# Patient Record
Sex: Female | Born: 1990 | Race: White | Hispanic: No | Marital: Single | Smoking: Current every day smoker
Health system: Southern US, Community
[De-identification: ages and names within clinical notes are randomized; demographics above are authoritative.]

## PROBLEM LIST (undated history)

## (undated) DIAGNOSIS — J449 Chronic obstructive pulmonary disease, unspecified: Secondary | ICD-10-CM

## (undated) DIAGNOSIS — D332 Benign neoplasm of brain, unspecified: Secondary | ICD-10-CM

## (undated) DIAGNOSIS — J45909 Unspecified asthma, uncomplicated: Secondary | ICD-10-CM

## (undated) DIAGNOSIS — R569 Unspecified convulsions: Secondary | ICD-10-CM

## (undated) DIAGNOSIS — J219 Acute bronchiolitis, unspecified: Secondary | ICD-10-CM

## (undated) HISTORY — PX: SKIN GRAFT: SHX250

## (undated) HISTORY — PX: WRIST SURGERY: SHX841

---

## 2012-02-08 DIAGNOSIS — E229 Hyperfunction of pituitary gland, unspecified: Secondary | ICD-10-CM | POA: Insufficient documentation

## 2012-02-08 DIAGNOSIS — D352 Benign neoplasm of pituitary gland: Secondary | ICD-10-CM | POA: Insufficient documentation

## 2014-09-24 DIAGNOSIS — J45909 Unspecified asthma, uncomplicated: Secondary | ICD-10-CM | POA: Insufficient documentation

## 2014-11-02 DIAGNOSIS — F3130 Bipolar disorder, current episode depressed, mild or moderate severity, unspecified: Secondary | ICD-10-CM | POA: Insufficient documentation

## 2016-01-16 DIAGNOSIS — Z720 Tobacco use: Secondary | ICD-10-CM | POA: Insufficient documentation

## 2016-01-16 DIAGNOSIS — Z87898 Personal history of other specified conditions: Secondary | ICD-10-CM | POA: Insufficient documentation

## 2016-01-16 DIAGNOSIS — F1291 Cannabis use, unspecified, in remission: Secondary | ICD-10-CM | POA: Insufficient documentation

## 2016-01-17 DIAGNOSIS — M5136 Other intervertebral disc degeneration, lumbar region: Secondary | ICD-10-CM | POA: Insufficient documentation

## 2016-01-24 DIAGNOSIS — F603 Borderline personality disorder: Secondary | ICD-10-CM | POA: Insufficient documentation

## 2016-09-08 DIAGNOSIS — F4310 Post-traumatic stress disorder, unspecified: Secondary | ICD-10-CM | POA: Insufficient documentation

## 2017-03-10 ENCOUNTER — Emergency Department (HOSPITAL_COMMUNITY): Payer: Medicaid Other

## 2017-03-10 ENCOUNTER — Encounter (HOSPITAL_COMMUNITY): Payer: Self-pay

## 2017-03-10 DIAGNOSIS — Z79899 Other long term (current) drug therapy: Secondary | ICD-10-CM | POA: Diagnosis not present

## 2017-03-10 DIAGNOSIS — R0789 Other chest pain: Secondary | ICD-10-CM | POA: Insufficient documentation

## 2017-03-10 DIAGNOSIS — F17200 Nicotine dependence, unspecified, uncomplicated: Secondary | ICD-10-CM | POA: Diagnosis not present

## 2017-03-10 DIAGNOSIS — J40 Bronchitis, not specified as acute or chronic: Secondary | ICD-10-CM | POA: Insufficient documentation

## 2017-03-10 DIAGNOSIS — R079 Chest pain, unspecified: Secondary | ICD-10-CM | POA: Diagnosis present
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
## 2017-03-11 ENCOUNTER — Emergency Department (HOSPITAL_COMMUNITY)
Admission: EM | Admit: 2017-03-11 | Discharge: 2017-03-11 | Disposition: A | Discharge status: Home / Self Care | Payer: Medicaid Other | Attending: Emergency Medicine | Admitting: Emergency Medicine

## 2017-03-11 DIAGNOSIS — J40 Bronchitis, not specified as acute or chronic: Secondary | ICD-10-CM

## 2017-03-11 DIAGNOSIS — R0789 Other chest pain: Secondary | ICD-10-CM

## 2017-03-11 HISTORY — DX: Unspecified asthma, uncomplicated: J45.909

## 2017-03-11 HISTORY — DX: Benign neoplasm of brain, unspecified: D33.2

## 2017-03-11 HISTORY — DX: Unspecified convulsions: R56.9

## 2017-03-11 MED ORDER — DEXAMETHASONE SODIUM PHOSPHATE 4 MG/ML IJ SOLN
8.0000 mg | Freq: Once | INTRAMUSCULAR | Status: AC
  Administered 2017-03-11: 8 mg via INTRAMUSCULAR
  Filled 2017-03-11: qty 2

## 2017-03-11 MED ORDER — DEXAMETHASONE 4 MG PO TABS: 4.0000 mg | ORAL_TABLET | Freq: Two times a day (BID) | ORAL | 0 refills | Status: DC

## 2017-03-11 MED ORDER — IBUPROFEN 600 MG PO TABS: 600.0000 mg | ORAL_TABLET | Freq: Four times a day (QID) | ORAL | 0 refills | Status: DC

## 2017-03-11 MED ORDER — HYDROCOD POLST-CPM POLST ER 10-8 MG/5ML PO SUER
5.0000 mL | Freq: Once | ORAL | Status: AC
  Administered 2017-03-11: 5 mL via ORAL
  Filled 2017-03-11: qty 5

## 2017-03-11 MED ORDER — PROMETHAZINE-DM 6.25-15 MG/5ML PO SYRP: 5.0000 mL | ORAL_SOLUTION | Freq: Four times a day (QID) | ORAL | 0 refills | Status: DC | PRN

## 2017-03-11 MED ORDER — KETOROLAC TROMETHAMINE 10 MG PO TABS
10.0000 mg | ORAL_TABLET | Freq: Once | ORAL | Status: AC
  Administered 2017-03-11: 10 mg via ORAL
  Filled 2017-03-11: qty 1

## 2017-03-11 NOTE — Discharge Instructions (Signed)
Your x-ray is negative for pneumonia, collapsed lung, or other acute lung problem. Your x-ray is negative for rib fracture or rib dislocation. Your examination favors some bronchitis changes. Please use Decadron 2 times daily. Use ibuprofen with breakfast, lunch, dinner, and at bedtime. Use promethazine DM cough medication every 6 hours for cough and congestion. Please increase fluids. Please stop smoking.

## 2017-03-11 NOTE — ED Notes (Signed)
ED Provider at bedside. 
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
## 2017-03-16 ENCOUNTER — Ambulatory Visit (INDEPENDENT_AMBULATORY_CARE_PROVIDER_SITE_OTHER): Payer: Medicaid Other | Admitting: Advanced Practice Midwife

## 2017-03-16 ENCOUNTER — Encounter: Payer: Self-pay | Admitting: Advanced Practice Midwife

## 2017-03-16 VITALS — BP 118/68 | HR 71 | Ht 67.0 in | Wt 292.0 lb

## 2017-03-16 DIAGNOSIS — Z30432 Encounter for removal of intrauterine contraceptive device: Secondary | ICD-10-CM | POA: Diagnosis not present

## 2017-03-16 NOTE — Progress Notes (Signed)
  Tina Wood 26 y.o.  Vitals:   03/16/17 1448  BP: 118/68  Pulse: 71   Past Medical History:  Diagnosis Date  . Asthma   . Brain tumor (benign) ([place])   . Seizures ([place])    Past Surgical History:  Procedure Laterality Date  . SKIN GRAFT     family history is not on file.  Current Outpatient Prescriptions:  .  ARIPiprazole (ABILIFY) 10 MG tablet, Take 10 mg by mouth daily., Disp: , Rfl:  .  busPIRone (BUSPAR) 7.5 MG tablet, Take 7.5 mg by mouth 3 (three) times daily., Disp: , Rfl:  .  dexamethasone (DECADRON) 4 MG tablet, Take 1 tablet (4 mg total) by mouth 2 (two) times daily with a meal., Disp: 12 tablet, Rfl: 0 .  ibuprofen (ADVIL,MOTRIN) 600 MG tablet, Take 1 tablet (600 mg total) by mouth 4 (four) times daily., Disp: 30 tablet, Rfl: 0 .  naproxen (NAPROSYN) 250 MG tablet, Take by mouth 2 (two) times daily with a meal., Disp: , Rfl:     Here for IUD removal.  She had the Mirena IUD placed in July last year and would like it removed to get pregnant. Also, she has been havin g sparp pains for 2 mo nths Says ED [place] says it was in the right position.  A graves speculum was placed, and the strings were not visible.A long [name] was u=inderted just inside the os and the strings were grasped with a curved [name] and the IUD easily removed.  The IUD was only 1 cm above where I grasped the strings, so it was most likely in the cervix.Pt to start PNV.   Pt given IUD removal f/u instructions.

## 2017-03-17 ENCOUNTER — Emergency Department (HOSPITAL_COMMUNITY)
Admission: EM | Admit: 2017-03-17 | Discharge: 2017-03-18 | Disposition: A | Discharge status: Home / Self Care | Payer: Medicaid Other | Attending: Emergency Medicine | Admitting: Emergency Medicine

## 2017-03-17 ENCOUNTER — Encounter (HOSPITAL_COMMUNITY): Payer: Self-pay | Admitting: Emergency Medicine

## 2017-03-17 DIAGNOSIS — F17210 Nicotine dependence, cigarettes, uncomplicated: Secondary | ICD-10-CM | POA: Insufficient documentation

## 2017-03-17 DIAGNOSIS — Y939 Activity, unspecified: Secondary | ICD-10-CM | POA: Insufficient documentation

## 2017-03-17 DIAGNOSIS — Z86011 Personal history of benign neoplasm of the brain: Secondary | ICD-10-CM | POA: Insufficient documentation

## 2017-03-17 DIAGNOSIS — Y999 Unspecified external cause status: Secondary | ICD-10-CM | POA: Insufficient documentation

## 2017-03-17 DIAGNOSIS — M79661 Pain in right lower leg: Secondary | ICD-10-CM | POA: Diagnosis present

## 2017-03-17 DIAGNOSIS — J45909 Unspecified asthma, uncomplicated: Secondary | ICD-10-CM | POA: Diagnosis not present

## 2017-03-17 DIAGNOSIS — W57XXXA Bitten or stung by nonvenomous insect and other nonvenomous arthropods, initial encounter: Secondary | ICD-10-CM | POA: Diagnosis not present

## 2017-03-17 DIAGNOSIS — G43009 Migraine without aura, not intractable, without status migrainosus: Secondary | ICD-10-CM | POA: Diagnosis not present

## 2017-03-17 DIAGNOSIS — Y929 Unspecified place or not applicable: Secondary | ICD-10-CM | POA: Diagnosis not present

## 2017-03-17 DIAGNOSIS — Z79899 Other long term (current) drug therapy: Secondary | ICD-10-CM | POA: Diagnosis not present

## 2017-03-17 DIAGNOSIS — S80861A Insect bite (nonvenomous), right lower leg, initial encounter: Secondary | ICD-10-CM | POA: Insufficient documentation

## 2017-03-17 MED ORDER — DIPHENHYDRAMINE HCL 50 MG/ML IJ SOLN
50.0000 mg | Freq: Once | INTRAMUSCULAR | Status: AC
  Administered 2017-03-18: 50 mg via INTRAMUSCULAR
  Filled 2017-03-17: qty 1

## 2017-03-17 MED ORDER — FAMOTIDINE 20 MG PO TABS
20.0000 mg | ORAL_TABLET | Freq: Once | ORAL | Status: AC
  Administered 2017-03-18: 20 mg via ORAL
  Filled 2017-03-17: qty 1

## 2017-03-17 MED ORDER — PREDNISONE 50 MG PO TABS
60.0000 mg | ORAL_TABLET | Freq: Once | ORAL | Status: AC
  Administered 2017-03-18: 60 mg via ORAL
  Filled 2017-03-17: qty 1

## 2017-03-17 MED ORDER — METOCLOPRAMIDE HCL 5 MG/ML IJ SOLN: 20.0000 mg | Freq: Once | INTRAVENOUS | Status: DC

## 2017-03-17 NOTE — ED Triage Notes (Signed)
Pt c/o bee sting to the right leg x 75minutes ago. Pt states right leg is swollen and numb.

## 2017-03-17 NOTE — ED Notes (Signed)
Upon arrival into pts room. Pt bent and placed her full body weight on affected right leg when transferring from wheelchair to bed.

## 2017-03-18 MED ORDER — FAMOTIDINE 20 MG PO TABS: 20.0000 mg | ORAL_TABLET | Freq: Two times a day (BID) | ORAL | 0 refills | Status: DC

## 2017-03-18 MED ORDER — METOCLOPRAMIDE HCL 5 MG/ML IJ SOLN
20.0000 mg | Freq: Once | INTRAMUSCULAR | Status: AC
  Administered 2017-03-18: 20 mg via INTRAMUSCULAR
  Filled 2017-03-18: qty 4

## 2017-03-18 NOTE — ED Provider Notes (Signed)
[facility] DEPT Provider Note   CSN: [id number] Arrival date & time: 03/17/17  2157  Time seen 23:50 PM   History   Chief Complaint Chief Complaint  Patient presents with  . Leg Pain    HPI Tina Wood is a 26 y.o. female.  HPI  patient states she was sitting on someone else's porch about 25-30 minutes prior to arrival and she got stung by a bee on her right lower leg. She states it is itching and burning, she has taken no medications. She states she feels short of breath and feels like she's gasping for air however she is calmly lying on her stretcher. She states she feels like she's having trouble breathing from her chest, she denies any swelling in her throat, tongue, lips, or hives or itching on her body. She states she had a reaction about 4 years ago and was prescribed an EpiPen. She did not have it today. She also states after the sting she now has a migraine headache which she's had in the past. She states she has a pounding headache in the frontal and temples of bilateral sides of her head with some nausea without vomiting or change in her vision.  PCP none  Past Medical History:  Diagnosis Date  . Asthma   . Brain tumor (benign) ([place])   . Seizures [facility])     Patient Active Problem List   Diagnosis Date Noted  . Encounter for IUD removal 03/16/2017    Past Surgical History:  Procedure Laterality Date  . SKIN GRAFT      OB History    Gravida Para Term Preterm AB Living   2 1 1   1 1    SAB TAB Ectopic Multiple Live Births   1               Home Medications    Prior to Admission medications   Medication Sig Start Date End Date Taking? Authorizing Provider  ARIPiprazole (ABILIFY) 10 MG tablet Take 10 mg by mouth daily.   Yes [provider]  busPIRone (BUSPAR) 7.5 MG tablet Take 7.5 mg by mouth 3 (three) times daily.   Yes [provider]  dexamethasone (DECADRON) 4 MG tablet Take 1 tablet (4 mg total) by mouth 2 (two) times daily  with a meal. 03/11/17  Yes Lily Kocher, PA-C  ibuprofen (ADVIL,MOTRIN) 600 MG tablet Take 1 tablet (600 mg total) by mouth 4 (four) times daily. 03/11/17  Yes Lily Kocher, PA-C  famotidine (PEPCID) 20 MG tablet Take 1 tablet (20 mg total) by mouth 2 (two) times daily. 03/18/17   Rolland Porter, MD    Family History History reviewed. No pertinent family history.  Social History Social History  Substance Use Topics  . Smoking status: Current Every Day Smoker    Packs/day: 1.00    Types: Cigarettes  . Smokeless tobacco: Never Used  . Alcohol use No  on disability for a benign pituitary tumor   Allergies   Bee venom   Review of Systems Review of Systems  All other systems reviewed and are negative.    Physical Exam Updated Vital Signs BP 98/60   Pulse 91   Temp 98.6 F (37 C)   Resp 18   Ht 5\' 7"  (1.702 m)   Wt (!) 142.9 kg (315 lb)   SpO2 99%   BMI 49.34 kg/m   Vital signs normal except borderline hypotension   Physical Exam  Constitutional: She is oriented to person, place,  and time. She appears well-developed and well-nourished.  obese  HENT:  Head: Normocephalic.  Right Ear: External ear normal.  Left Ear: External ear normal.  Nose: Nose normal.  Mouth/Throat: Oropharynx is clear and moist.  Eyes: Conjunctivae and EOM are normal.  Neck: Neck supple.  Cardiovascular: Normal rate.   Pulmonary/Chest: Effort normal. No respiratory distress.  Musculoskeletal: Normal range of motion. She exhibits tenderness. She exhibits no edema or deformity.  Neurological: She is alert and oriented to person, place, and time. No cranial nerve deficit.  Skin: Skin is warm and dry. Capillary refill takes less than 2 seconds. No rash noted. There is erythema. There is pallor.  Psychiatric: Her speech is normal and behavior is normal.  Flat affect  Nursing note and vitals reviewed.      ED Treatments / Results  Labs (all labs ordered are listed, but only abnormal results  are displayed) Labs Reviewed - No data to display  EKG  EKG Interpretation None       Radiology No results found.  Procedures Procedures (including critical care time)  Medications Ordered in ED Medications  diphenhydrAMINE (BENADRYL) injection 50 mg (50 mg Intramuscular Given 03/18/17 0006)  predniSONE (DELTASONE) tablet 60 mg (60 mg Oral Given 03/18/17 0006)  famotidine (PEPCID) tablet 20 mg (20 mg Oral Given 03/18/17 0006)  metoCLOPramide (REGLAN) injection 20 mg (20 mg Intramuscular Given 03/18/17 0006)     Initial Impression / Assessment and Plan / ED Course  I have reviewed the triage vital signs and the nursing notes.  Pertinent labs & imaging results that were available during my care of the patient were reviewed by me and considered in my medical decision making (see chart for details).   Patient was given IM Benadryl, and oral prednisone and Pepcid for her insect sting. For her migraine headache she was given Reglan IM. Patient chose IM over IV medication.   Recheck at 1:25 AM patient states her headaches gone and her insect bite  feels much better. Patient has an extremely well localized insect bite, she was not placed on steroids. At time of discharge she had the very small bite visible.  Final Clinical Impressions(s) / ED Diagnoses   Final diagnoses:  Insect bite of lower extremity, right, initial encounter  Migraine without aura and without status migrainosus, not intractable    New Prescriptions New Prescriptions   FAMOTIDINE (PEPCID) 20 MG TABLET    Take 1 tablet (20 mg total) by mouth 2 (two) times daily.  OTC benadryl  Plan discharge  Rolland Porter, MD, Barbette Or, MD 03/18/17 [phone]

## 2017-03-18 NOTE — Discharge Instructions (Signed)
Use ice pack over the bite for comfort. You can take benadryl OTC 25-50 mg every 6 hrs as needed for itching or discomfort. Take the pepcid twice a day until gone.   Recheck if you get increased redness, swelling, or you get swelling of your lips, tongue, throat, have trouble breathing.

## 2017-03-18 NOTE — ED Notes (Signed)
Pt ambulatory to waiting room. Pt verbalized understanding of discharge instructions.   

## 2017-04-02 ENCOUNTER — Encounter (HOSPITAL_COMMUNITY): Payer: Self-pay | Admitting: Emergency Medicine

## 2017-04-02 ENCOUNTER — Emergency Department (HOSPITAL_COMMUNITY)
Admission: EM | Admit: 2017-04-02 | Discharge: 2017-04-02 | Disposition: A | Discharge status: Home / Self Care | Payer: Medicaid Other | Attending: Emergency Medicine | Admitting: Emergency Medicine

## 2017-04-02 DIAGNOSIS — Z79899 Other long term (current) drug therapy: Secondary | ICD-10-CM | POA: Diagnosis not present

## 2017-04-02 DIAGNOSIS — R0602 Shortness of breath: Secondary | ICD-10-CM | POA: Insufficient documentation

## 2017-04-02 DIAGNOSIS — F17210 Nicotine dependence, cigarettes, uncomplicated: Secondary | ICD-10-CM | POA: Insufficient documentation

## 2017-04-02 DIAGNOSIS — L240 Irritant contact dermatitis due to detergents: Secondary | ICD-10-CM | POA: Diagnosis not present

## 2017-04-02 DIAGNOSIS — L299 Pruritus, unspecified: Secondary | ICD-10-CM | POA: Diagnosis present

## 2017-04-02 DIAGNOSIS — J45909 Unspecified asthma, uncomplicated: Secondary | ICD-10-CM | POA: Insufficient documentation

## 2017-04-02 NOTE — Discharge Instructions (Signed)
Take benadryl for itching.  Use topical hydrocortisone cream if continued redness

## 2017-04-02 NOTE — ED Triage Notes (Addendum)
Patient complaining of hives to right leg that started approximately 15 minutes prior to arrival to ER. States she does not know what she is allergic to. States "my throat hurts like it's hard to breathe and when I take a deep breath my chest kinda hurts." NAD noted at triage.

## 2017-04-02 NOTE — ED Provider Notes (Signed)
[facility] DEPT Provider Note   CSN: [id number] Arrival date & time: 04/02/17  2019     History   Chief Complaint Chief Complaint  Patient presents with  . Allergic Reaction    HPI Tina Wood is a 26 y.o. female.  The history is provided by the patient. No language interpreter was used.  Allergic Reaction  Presenting symptoms: difficulty breathing and itching   Severity:  Mild Prior allergic episodes:  No prior episodes Context: chemicals and medications   Relieved by:  Nothing Worsened by:  Nothing Ineffective treatments:  None tried Pt used a lice treatment and has irritation to an area on her leg and her scalp.  No current shortness of breath.  No throat tightness  Past Medical History:  Diagnosis Date  . Asthma   . Brain tumor (benign) ([place])   . Seizures [facility])     Patient Active Problem List   Diagnosis Date Noted  . Encounter for IUD removal 03/16/2017    Past Surgical History:  Procedure Laterality Date  . SKIN GRAFT    . WRIST SURGERY      OB History    Gravida Para Term Preterm AB Living   2 1 1   1 1    SAB TAB Ectopic Multiple Live Births   1               Home Medications    Prior to Admission medications   Medication Sig Start Date End Date Taking? Authorizing Provider  ARIPiprazole (ABILIFY) 10 MG tablet Take 10 mg by mouth daily.    [provider]  busPIRone (BUSPAR) 7.5 MG tablet Take 7.5 mg by mouth 3 (three) times daily.    [provider]  dexamethasone (DECADRON) 4 MG tablet Take 1 tablet (4 mg total) by mouth 2 (two) times daily with a meal. 03/11/17   Lily Kocher, PA-C  famotidine (PEPCID) 20 MG tablet Take 1 tablet (20 mg total) by mouth 2 (two) times daily. 03/18/17   Rolland Porter, MD  ibuprofen (ADVIL,MOTRIN) 600 MG tablet Take 1 tablet (600 mg total) by mouth 4 (four) times daily. 03/11/17   Lily Kocher, PA-C    Family History History reviewed. No pertinent family history.  Social History Social  History  Substance Use Topics  . Smoking status: Current Every Day Smoker    Packs/day: 1.00    Types: Cigarettes  . Smokeless tobacco: Never Used  . Alcohol use No     Allergies   Cherry; Pomegranate [punica]; Bee venom; and Grass extracts [gramineae pollens]   Review of Systems Review of Systems  Skin: Positive for itching.  All other systems reviewed and are negative.    Physical Exam Updated Vital Signs BP (!) 110/59 (BP Location: Right Arm)   Pulse (!) 125   Temp 98.4 F (36.9 C) (Oral)   Resp 20   Ht 5\' 7"  (1.702 m)   Wt (!) 142.9 kg (315 lb)   SpO2 99%   BMI 49.34 kg/m   Physical Exam  Constitutional: She appears well-developed and well-nourished. No distress.  HENT:  Head: Normocephalic and atraumatic.  Eyes: Conjunctivae are normal.  Neck: Neck supple.  Cardiovascular: Normal rate and regular rhythm.   No murmur heard. Pulmonary/Chest: Effort normal and breath sounds normal. No respiratory distress.  Abdominal: There is no tenderness.  Musculoskeletal: She exhibits no edema.  Erythema right upper inner thigh 4x10 cm area,    Neurological: She is alert.  Skin: Skin is warm  and dry.  Psychiatric: She has a normal mood and affect.  Nursing note and vitals reviewed.    ED Treatments / Results  Labs (all labs ordered are listed, but only abnormal results are displayed) Labs Reviewed - No data to display  EKG  EKG Interpretation None       Radiology No results found.  Procedures Procedures (including critical care time)  Medications Ordered in ED Medications - No data to display   Initial Impression / Assessment and Plan / ED Course  I have reviewed the triage vital signs and the nursing notes.  Pertinent labs & imaging results that were available during my care of the patient were reviewed by me and considered in my medical decision making (see chart for details).       Final Clinical Impressions(s) / ED Diagnoses   Final  diagnoses:  Contact dermatitis due to detergent, unspecified contact dermatitis type    New Prescriptions New Prescriptions   No medications on file   Pt looks well,  No sign of systemic reaction,  Pt advised benadryl and topical hydrocortisone    Fransico Meadow, PA-C 04/02/17 2153    Fredia Sorrow, MD 04/03/17 1626

## 2017-04-02 NOTE — ED Notes (Signed)
Pt c/o redness noted to right upper thigh area, as well as sob that started while on the way to see her boyfriend who is in the emergency room as well, pt states that she treated herself for lice today and is not sure if some of the medication got on her leg, pt sitting on stretcher upon RN entering room, no distress noted,

## 2017-04-02 NOTE — ED Notes (Signed)
ED Provider at bedside. 

## 2017-04-11 ENCOUNTER — Encounter (HOSPITAL_COMMUNITY): Payer: Self-pay | Admitting: *Deleted

## 2017-04-11 ENCOUNTER — Emergency Department (HOSPITAL_COMMUNITY)
Admission: EM | Admit: 2017-04-11 | Discharge: 2017-04-11 | Disposition: A | Discharge status: Home / Self Care | Payer: Medicaid Other | Attending: Emergency Medicine | Admitting: Emergency Medicine

## 2017-04-11 DIAGNOSIS — Z5321 Procedure and treatment not carried out due to patient leaving prior to being seen by health care provider: Secondary | ICD-10-CM | POA: Diagnosis not present

## 2017-04-11 DIAGNOSIS — R2241 Localized swelling, mass and lump, right lower limb: Secondary | ICD-10-CM | POA: Diagnosis present

## 2017-04-11 DIAGNOSIS — R0602 Shortness of breath: Secondary | ICD-10-CM | POA: Insufficient documentation

## 2017-04-11 NOTE — ED Triage Notes (Signed)
Pt was stung by bee on the back of right leg today. Pt c/o right leg swelling and SOB. Pt's respirations even, unlabored, lung sounds clear. NAD noted in triage.

## 2017-04-11 NOTE — ED Notes (Signed)
Per registration patient left. Registration stated patient was ambulatory at triage with steady gait noted.

## 2017-04-11 NOTE — ED Triage Notes (Signed)
Pt called for triage at 1750. Pt in restroom at this time.

## 2017-04-11 NOTE — ED Notes (Addendum)
Patient reports of bee sting to back of right leg, states she has hx of shortness of breath. Patient reports of shortness of breath. RR even and unlabored.  NAD at this time.

## 2017-04-14 ENCOUNTER — Encounter (HOSPITAL_COMMUNITY): Payer: Self-pay | Admitting: Emergency Medicine

## 2017-04-14 ENCOUNTER — Emergency Department (HOSPITAL_COMMUNITY)
Admission: EM | Admit: 2017-04-14 | Discharge: 2017-04-15 | Disposition: A | Discharge status: Home / Self Care | Payer: Medicaid Other | Attending: Emergency Medicine | Admitting: Emergency Medicine

## 2017-04-14 DIAGNOSIS — F17210 Nicotine dependence, cigarettes, uncomplicated: Secondary | ICD-10-CM | POA: Insufficient documentation

## 2017-04-14 DIAGNOSIS — G43009 Migraine without aura, not intractable, without status migrainosus: Secondary | ICD-10-CM | POA: Diagnosis not present

## 2017-04-14 DIAGNOSIS — Z91040 Latex allergy status: Secondary | ICD-10-CM | POA: Diagnosis not present

## 2017-04-14 DIAGNOSIS — Z79899 Other long term (current) drug therapy: Secondary | ICD-10-CM | POA: Insufficient documentation

## 2017-04-14 DIAGNOSIS — J45909 Unspecified asthma, uncomplicated: Secondary | ICD-10-CM | POA: Insufficient documentation

## 2017-04-14 DIAGNOSIS — R51 Headache: Secondary | ICD-10-CM | POA: Diagnosis present

## 2017-04-14 MED ORDER — METOCLOPRAMIDE HCL 5 MG/ML IJ SOLN
10.0000 mg | Freq: Once | INTRAMUSCULAR | Status: AC
  Administered 2017-04-14: 10 mg via INTRAMUSCULAR
  Filled 2017-04-14: qty 2

## 2017-04-14 MED ORDER — DIPHENHYDRAMINE HCL 25 MG PO CAPS
25.0000 mg | ORAL_CAPSULE | Freq: Once | ORAL | Status: AC
  Administered 2017-04-14: 25 mg via ORAL
  Filled 2017-04-14: qty 1

## 2017-04-14 MED ORDER — KETOROLAC TROMETHAMINE 60 MG/2ML IM SOLN
60.0000 mg | Freq: Once | INTRAMUSCULAR | Status: AC
  Administered 2017-04-14: 60 mg via INTRAMUSCULAR
  Filled 2017-04-14: qty 2

## 2017-04-14 NOTE — ED Notes (Signed)
Pt sleeping. 

## 2017-04-14 NOTE — ED Triage Notes (Signed)
Pt c/o headache all day and states she has a brain tumor but is not followed by a doctor at this time.

## 2017-04-15 NOTE — Discharge Instructions (Signed)
Follow-up with your doctor for recheck.  Return here if needed 

## 2017-04-15 NOTE — ED Provider Notes (Signed)
[facility] DEPT Provider Note   CSN: [id number] Arrival date & time: 04/14/17  2115     History   Chief Complaint Chief Complaint  Patient presents with  . Headache    HPI Tina Wood is a 26 y.o. female.  HPI  Tina Wood is a 26 y.o. female who presents to the Emergency Department complaining of gradual onset of diffuse headache.  She states she woke up with a worsening headache today that began in the top of her head that radiates to both temples.  She describes the pain as throbbing and increases with light or loud noises.  She reports hx of migraine headaches and this pain feels similar.  She reports improvement with previous headaches after taking ibuprofen or other over the counter pain relievers, but she did not take any today.  She denies fever, chills, neck pain or stiffness, visual deficits, facial numbness or weakness.   Past Medical History:  Diagnosis Date  . Asthma   . Brain tumor (benign) ([place])   . Seizures [facility])     Patient Active Problem List   Diagnosis Date Noted  . Encounter for IUD removal 03/16/2017    Past Surgical History:  Procedure Laterality Date  . SKIN GRAFT    . WRIST SURGERY      OB History    Gravida Para Term Preterm AB Living   2 1 1   1 1    SAB TAB Ectopic Multiple Live Births   1               Home Medications    Prior to Admission medications   Medication Sig Start Date End Date Taking? Authorizing Provider  busPIRone (BUSPAR) 7.5 MG tablet Take 7.5 mg by mouth 3 (three) times daily.   Yes [provider]  dexamethasone (DECADRON) 4 MG tablet Take 1 tablet (4 mg total) by mouth 2 (two) times daily with a meal. 03/11/17  Yes Lily Kocher, PA-C  ibuprofen (ADVIL,MOTRIN) 600 MG tablet Take 1 tablet (600 mg total) by mouth 4 (four) times daily. 03/11/17  Yes Lily Kocher, PA-C  ARIPiprazole (ABILIFY) 10 MG tablet Take 10 mg by mouth daily.    [provider]  famotidine (PEPCID) 20 MG tablet Take  1 tablet (20 mg total) by mouth 2 (two) times daily. Patient not taking: Reported on 04/14/2017 03/18/17   Rolland Porter, MD    Family History History reviewed. No pertinent family history.  Social History Social History  Substance Use Topics  . Smoking status: Current Every Day Smoker    Packs/day: 1.00    Types: Cigarettes  . Smokeless tobacco: Never Used  . Alcohol use No     Allergies   Cherry; Pomegranate [punica]; Bee venom; Nicotine; Grass extracts [gramineae pollens]; Latex; and Shellfish allergy   Review of Systems Review of Systems  Constitutional: Negative for activity change, appetite change and fever.  HENT: Negative for facial swelling and trouble swallowing.   Eyes: Positive for photophobia. Negative for pain and visual disturbance.  Respiratory: Negative for shortness of breath.   Cardiovascular: Negative for chest pain.  Gastrointestinal: Negative for abdominal pain, nausea and vomiting.  Genitourinary: Negative for dysuria.  Musculoskeletal: Negative for neck pain and neck stiffness.  Skin: Negative for rash and wound.  Neurological: Positive for headaches. Negative for dizziness, facial asymmetry, speech difficulty, weakness and numbness.  Psychiatric/Behavioral: Negative for confusion and decreased concentration.  All other systems reviewed and are negative.    Physical Exam Updated  Vital Signs BP 121/75 (BP Location: Left Arm)   Pulse (!) 110   Temp 98.9 F (37.2 C) (Oral)   Resp 18   Ht 5\' 7"  (1.702 m)   Wt 131.1 kg (289 lb)   SpO2 98%   BMI 45.26 kg/m   Physical Exam  Constitutional: She is oriented to person, place, and time. She appears well-developed and well-nourished. No distress.  HENT:  Head: Normocephalic and atraumatic.  Mouth/Throat: Oropharynx is clear and moist.  Eyes: Pupils are equal, round, and reactive to light. EOM are normal.  Neck: Normal range of motion and phonation normal. Neck supple. No spinous process tenderness and  no muscular tenderness present. No neck rigidity. No Kernig's sign noted.  Cardiovascular: Normal rate, regular rhythm and intact distal pulses.   No murmur heard. Pulmonary/Chest: Effort normal and breath sounds normal. No respiratory distress.  Abdominal: Soft. She exhibits no distension. There is no tenderness.  Musculoskeletal: Normal range of motion.  Neurological: She is alert and oriented to person, place, and time. She has normal strength. No cranial nerve deficit or sensory deficit. She exhibits normal muscle tone. Coordination and gait normal. GCS eye subscore is 4. GCS verbal subscore is 5. GCS motor subscore is 6.  Reflex Scores:      Tricep reflexes are 2+ on the right side and 2+ on the left side.      Bicep reflexes are 2+ on the right side and 2+ on the left side. Skin: Skin is warm and dry. Capillary refill takes less than 2 seconds.  Psychiatric: She has a normal mood and affect.  Nursing note and vitals reviewed.    ED Treatments / Results  Labs (all labs ordered are listed, but only abnormal results are displayed) Labs Reviewed - No data to display  EKG  EKG Interpretation None       Radiology No results found.  Procedures Procedures (including critical care time)  Medications Ordered in ED Medications  ketorolac (TORADOL) injection 60 mg (60 mg Intramuscular Given 04/14/17 2303)  metoCLOPramide (REGLAN) injection 10 mg (10 mg Intramuscular Given 04/14/17 2303)  diphenhydrAMINE (BENADRYL) capsule 25 mg (25 mg Oral Given 04/14/17 2304)     Initial Impression / Assessment and Plan / ED Course  I have reviewed the triage vital signs and the nursing notes.  Pertinent labs & imaging results that were available during my care of the patient were reviewed by me and considered in my medical decision making (see chart for details).     0010  On recheck, pt is feeling better, reports pain is down to "3"  States she is ready to go home.  Vitals stable.  No focal  neuro deficits, no meningeal signs. Headache pain similar to previous.   Appears stable for d/c  Final Clinical Impressions(s) / ED Diagnoses   Final diagnoses:  Migraine without aura and without status migrainosus, not intractable    New Prescriptions New Prescriptions   No medications on file     Kem Parkinson, PA-C 04/15/17 0037    Francine Graven, DO 04/16/17 1411

## 2017-04-20 ENCOUNTER — Emergency Department (HOSPITAL_COMMUNITY)
Admission: EM | Admit: 2017-04-20 | Discharge: 2017-04-20 | Disposition: A | Discharge status: Home / Self Care | Payer: Medicaid Other | Attending: Emergency Medicine | Admitting: Emergency Medicine

## 2017-04-20 ENCOUNTER — Encounter (HOSPITAL_COMMUNITY): Payer: Self-pay | Admitting: Emergency Medicine

## 2017-04-20 DIAGNOSIS — F17210 Nicotine dependence, cigarettes, uncomplicated: Secondary | ICD-10-CM | POA: Diagnosis not present

## 2017-04-20 DIAGNOSIS — H6122 Impacted cerumen, left ear: Secondary | ICD-10-CM | POA: Diagnosis not present

## 2017-04-20 DIAGNOSIS — Z91040 Latex allergy status: Secondary | ICD-10-CM | POA: Insufficient documentation

## 2017-04-20 DIAGNOSIS — H6502 Acute serous otitis media, left ear: Secondary | ICD-10-CM | POA: Diagnosis not present

## 2017-04-20 DIAGNOSIS — Z79899 Other long term (current) drug therapy: Secondary | ICD-10-CM | POA: Insufficient documentation

## 2017-04-20 DIAGNOSIS — H9203 Otalgia, bilateral: Secondary | ICD-10-CM | POA: Diagnosis present

## 2017-04-20 DIAGNOSIS — J45909 Unspecified asthma, uncomplicated: Secondary | ICD-10-CM | POA: Diagnosis not present

## 2017-04-20 MED ORDER — AMOXICILLIN-POT CLAVULANATE 875-125 MG PO TABS: 1.0000 | ORAL_TABLET | Freq: Two times a day (BID) | ORAL | 0 refills | Status: AC

## 2017-04-20 NOTE — ED Triage Notes (Signed)
Pt c/o bilateral ear pain x 3 days.

## 2017-04-20 NOTE — ED Provider Notes (Signed)
[facility] DEPT Provider Note   CSN: [id number] Arrival date & time: 04/20/17  4656     History   Chief Complaint Chief Complaint  Patient presents with  . Otalgia    HPI Tina Wood is a 26 y.o. female presenting with sudden onset bilateral otalgia for the last 3 days. No history of recent swimming or recurrent otitis media. She reports that she noted some bloody discharge in her left ear on her way here today. Denies fever, chills, or any other symptoms. She has been taking NyQuil and ibuprofen with some relief.  She later reported that she had been having a productive cough for a week that has been improving and patient was evaluated a few days ago with negative chest x-ray.  HPI  Past Medical History:  Diagnosis Date  . Asthma   . Brain tumor (benign) ([place])   . Seizures [facility])     Patient Active Problem List   Diagnosis Date Noted  . Encounter for IUD removal 03/16/2017    Past Surgical History:  Procedure Laterality Date  . SKIN GRAFT    . WRIST SURGERY      OB History    Gravida Para Term Preterm AB Living   2 1 1   1 1    SAB TAB Ectopic Multiple Live Births   1               Home Medications    Prior to Admission medications   Medication Sig Start Date End Date Taking? Authorizing Provider  amoxicillin-clavulanate (AUGMENTIN) 875-125 MG tablet Take 1 tablet by mouth 2 (two) times daily. 04/20/17 04/27/17  Avie Echevaria B, PA-C  ARIPiprazole (ABILIFY) 10 MG tablet Take 10 mg by mouth daily.    [provider]  busPIRone (BUSPAR) 7.5 MG tablet Take 7.5 mg by mouth 3 (three) times daily.    [provider]  dexamethasone (DECADRON) 4 MG tablet Take 1 tablet (4 mg total) by mouth 2 (two) times daily with a meal. 03/11/17   Lily Kocher, PA-C  famotidine (PEPCID) 20 MG tablet Take 1 tablet (20 mg total) by mouth 2 (two) times daily. Patient not taking: Reported on 04/14/2017 03/18/17   Rolland Porter, MD  ibuprofen (ADVIL,MOTRIN) 600  MG tablet Take 1 tablet (600 mg total) by mouth 4 (four) times daily. 03/11/17   Lily Kocher, PA-C    Family History History reviewed. No pertinent family history.  Social History Social History  Substance Use Topics  . Smoking status: Current Every Day Smoker    Packs/day: 1.00    Types: Cigarettes  . Smokeless tobacco: Never Used  . Alcohol use No     Allergies   Cherry; Pomegranate [punica]; Bee venom; Nicotine; Grass extracts [gramineae pollens]; Latex; and Shellfish allergy   Review of Systems Review of Systems  Constitutional: Negative for chills and fever.  HENT: Positive for congestion, ear discharge and ear pain. Negative for facial swelling, sinus pain, sinus pressure, sore throat, tinnitus and trouble swallowing.   Respiratory: Positive for cough. Negative for choking, shortness of breath, wheezing and stridor.   Cardiovascular: Negative for chest pain and palpitations.  Gastrointestinal: Negative for abdominal pain, nausea and vomiting.  Genitourinary: Negative for dysuria and hematuria.  Musculoskeletal: Negative for arthralgias, back pain, myalgias, neck pain and neck stiffness.  Skin: Negative for color change, pallor and rash.  Neurological: Negative for dizziness, seizures, syncope and headaches.     Physical Exam Updated Vital Signs BP 114/79   Pulse  86   Temp 98.4 F (36.9 C)   Resp 18   Ht 5\' 7"  (1.702 m)   Wt 131.1 kg (289 lb)   LMP 04/17/2017   SpO2 97%   BMI 45.26 kg/m   Physical Exam  Constitutional: She appears well-developed and well-nourished. No distress.  Afebrile, nontoxic-appearing, sitting comfortably in bed in no acute distress.  HENT:  Head: Normocephalic and atraumatic.  Right Ear: External ear normal.  Left Ear: External ear normal.  Left tympanic membrane with significant effusion and erythema. Initially difficult to visualize due to cerumen impaction. No concern for mastoiditis.  Eyes: Conjunctivae and EOM are normal.  Right eye exhibits no discharge. Left eye exhibits no discharge.  Neck: Normal range of motion.  Cardiovascular: Normal rate, regular rhythm and normal heart sounds.   No murmur heard. Pulmonary/Chest: Effort normal and breath sounds normal. No respiratory distress. She has no wheezes. She has no rales.  Musculoskeletal: Normal range of motion. She exhibits no edema or deformity.  Lymphadenopathy:    She has no cervical adenopathy.  Neurological: She is alert.  Skin: Skin is warm and dry. No rash noted. She is not diaphoretic. No erythema. No pallor.  Psychiatric: She has a normal mood and affect.  Nursing note and vitals reviewed.    ED Treatments / Results  Labs (all labs ordered are listed, but only abnormal results are displayed) Labs Reviewed - No data to display  EKG  EKG Interpretation None       Radiology No results found.  Procedures .Ear Cerumen Removal Date/Time: 04/20/2017 8:34 PM Performed by: Avie Echevaria B Authorized by: Avie Echevaria B   Consent:    Consent obtained:  Verbal   Consent given by:  Patient   Risks discussed:  Pain   Alternatives discussed:  No treatment Procedure details:    Location:  L ear   Procedure type: curette   Post-procedure details:    Inspection:  TM intact   Hearing quality:  Improved   Patient tolerance of procedure:  Tolerated well, no immediate complications   (including critical care time)  Medications Ordered in ED Medications - No data to display   Initial Impression / Assessment and Plan / ED Course  I have reviewed the triage vital signs and the nursing notes.  Pertinent labs & imaging results that were available during my care of the patient were reviewed by me and considered in my medical decision making (see chart for details).     Patient presenting with bilateral lateral otalgia and left otitis media. She has been having upper respiratory symptoms for about a week. Evaluated a few days ago for  productive cough with negative chest x-ray.  Patient reported improvement after cerumen removal.  Discharge home with Augmentin and close follow-up with PCP as needed. Patient was otherwise well-appearing, afebrile nontoxic with normal vital signs and stable. Advised to stay well-hydrated.  Discussed strict return precautions and advised to return to the emergency department if experiencing any new or worsening symptoms. Instructions were understood and patient agreed with discharge plan. Final Clinical Impressions(s) / ED Diagnoses   Final diagnoses:  Acute serous otitis media of left ear, recurrence not specified  Impacted cerumen of left ear    New Prescriptions New Prescriptions   AMOXICILLIN-CLAVULANATE (AUGMENTIN) 875-125 MG TABLET    Take 1 tablet by mouth 2 (two) times daily.     Dossie Der 04/20/17 2039    Nat Christen, MD 04/27/17 [phone]

## 2017-04-20 NOTE — ED Notes (Signed)
Pt c/o bilateral ear pain, worse on L. Has had bloody d/c from L ear, denies placing anything into ears. Also has decreased hearing in L side.

## 2017-04-20 NOTE — Discharge Instructions (Signed)
As discussed, take your entire course of antibiotics even if you feel better. Stay well hydrated drinking plenty of water.  Follow-up with your primary care provider as needed. Return if cough worsens, difficulty breathing, chest pain, or any other new concerning symptoms in the meantime.

## 2017-05-18 ENCOUNTER — Encounter (HOSPITAL_COMMUNITY): Payer: Self-pay

## 2017-05-18 ENCOUNTER — Ambulatory Visit (HOSPITAL_COMMUNITY)
Admission: EM | Admit: 2017-05-18 | Discharge: 2017-05-18 | Disposition: A | Discharge status: Home / Self Care | Payer: No Typology Code available for payment source | Source: Ambulatory Visit | Attending: Emergency Medicine | Admitting: Emergency Medicine

## 2017-05-18 ENCOUNTER — Emergency Department (HOSPITAL_COMMUNITY)
Admission: EM | Admit: 2017-05-18 | Discharge: 2017-05-19 | Disposition: A | Discharge status: Home / Self Care | Payer: Medicaid Other | Attending: Emergency Medicine | Admitting: Emergency Medicine

## 2017-05-18 DIAGNOSIS — F17210 Nicotine dependence, cigarettes, uncomplicated: Secondary | ICD-10-CM | POA: Insufficient documentation

## 2017-05-18 DIAGNOSIS — Z91040 Latex allergy status: Secondary | ICD-10-CM | POA: Insufficient documentation

## 2017-05-18 DIAGNOSIS — T7421XA Adult sexual abuse, confirmed, initial encounter: Secondary | ICD-10-CM | POA: Insufficient documentation

## 2017-05-18 DIAGNOSIS — J45909 Unspecified asthma, uncomplicated: Secondary | ICD-10-CM | POA: Insufficient documentation

## 2017-05-18 DIAGNOSIS — Z79899 Other long term (current) drug therapy: Secondary | ICD-10-CM | POA: Insufficient documentation

## 2017-05-18 DIAGNOSIS — Z0441 Encounter for examination and observation following alleged adult rape: Secondary | ICD-10-CM | POA: Insufficient documentation

## 2017-05-18 DIAGNOSIS — T7621XA Adult sexual abuse, suspected, initial encounter: Secondary | ICD-10-CM | POA: Diagnosis present
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
## 2017-05-19 LAB — POC URINE PREG, ED: Preg Test, Ur: NEGATIVE

## 2017-05-19 MED ORDER — PROMETHAZINE HCL 12.5 MG PO TABS
25.0000 mg | ORAL_TABLET | Freq: Four times a day (QID) | ORAL | Status: DC | PRN
  Administered 2017-05-19: 25 mg via ORAL

## 2017-05-19 MED ORDER — AZITHROMYCIN 250 MG PO TABS
1000.0000 mg | ORAL_TABLET | Freq: Once | ORAL | Status: AC
  Administered 2017-05-19: 1000 mg via ORAL
  Filled 2017-05-19: qty 4

## 2017-05-19 MED ORDER — LIDOCAINE HCL (PF) 1 % IJ SOLN
0.9000 mL | Freq: Once | INTRAMUSCULAR | Status: AC
  Administered 2017-05-19: 0.9 mL

## 2017-05-19 MED ORDER — METRONIDAZOLE 500 MG PO TABS
2000.0000 mg | ORAL_TABLET | Freq: Once | ORAL | Status: AC
  Administered 2017-05-19: 2000 mg via ORAL

## 2017-05-19 MED ORDER — ULIPRISTAL ACETATE 30 MG PO TABS
30.0000 mg | ORAL_TABLET | Freq: Once | ORAL | Status: AC
  Administered 2017-05-19: 30 mg via ORAL
  Filled 2017-05-19: qty 1

## 2017-05-19 MED ORDER — CEFTRIAXONE SODIUM 250 MG IJ SOLR
250.0000 mg | Freq: Once | INTRAMUSCULAR | Status: AC
  Administered 2017-05-19: 250 mg via INTRAMUSCULAR
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
## 2018-03-12 ENCOUNTER — Telehealth: Payer: Self-pay | Admitting: *Deleted

## 2018-03-12 ENCOUNTER — Ambulatory Visit (INDEPENDENT_AMBULATORY_CARE_PROVIDER_SITE_OTHER): Payer: Medicaid Other | Admitting: Adult Health

## 2018-03-12 ENCOUNTER — Encounter: Payer: Self-pay | Admitting: Adult Health

## 2018-03-12 ENCOUNTER — Encounter (INDEPENDENT_AMBULATORY_CARE_PROVIDER_SITE_OTHER): Payer: Self-pay

## 2018-03-12 VITALS — BP 98/65 | HR 67 | Ht 67.0 in | Wt 289.0 lb

## 2018-03-12 DIAGNOSIS — Z87898 Personal history of other specified conditions: Secondary | ICD-10-CM

## 2018-03-12 DIAGNOSIS — Z3202 Encounter for pregnancy test, result negative: Secondary | ICD-10-CM | POA: Diagnosis not present

## 2018-03-12 DIAGNOSIS — N926 Irregular menstruation, unspecified: Secondary | ICD-10-CM

## 2018-03-12 LAB — POCT URINE PREGNANCY: Preg Test, Ur: NEGATIVE

## 2018-03-12 MED ORDER — PRENATAL GUMMIES/DHA & FA 0.4-32.5 MG PO CHEW: 2.0000 | CHEWABLE_TABLET | Freq: Every day | ORAL | Status: DC

## 2018-03-12 NOTE — Telephone Encounter (Signed)
Pt has questions about recurrent miscarriage, but has had different partners.keep F/U appt

## 2018-03-12 NOTE — Progress Notes (Signed)
  Subjective:     Patient ID: Tina Wood, female   DOB: 03-14-1991, 27 y.o.   MRN: [id number]  HPI Tina Wood is a  27 year old white female in for UPT, has missed 2 periods and had 4+HPTs.   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 Reviewed medications and allergies.     Objective:   Physical Exam BP 98/65 (BP Location: Left Arm, Patient Position: Sitting, Cuff Size: Large)   Pulse 67   Ht 5\' 7"  (1.702 m)   Wt 289 lb (131.1 kg)   LMP 01/10/2018 (Approximate)   BMI 45.26 kg/m UPT negative. Skin warm and dry. Neck: mid line trachea, normal thyroid, good ROM, no lymphadenopathy noted. Lungs: clear to ausculation bilaterally. Cardiovascular: regular rate and rhythm.+facial hair. Will get [facility].    Assessment:     1. Missed periods   2. Pregnancy examination or test, negative result   3. History of seizures       Plan:     Continue PNV Gummies Check QHCG today Return in 4 weeks for pap and physical if not pregnant

## 2018-03-13 ENCOUNTER — Telehealth: Payer: Self-pay | Admitting: Adult Health

## 2018-03-13 LAB — BETA HCG QUANT (REF LAB): hCG Quant: <1 m[IU]/mL

## 2018-03-13 NOTE — Telephone Encounter (Signed)
Pt aware that QHCG<1, keep appt in August

## 2018-03-15 ENCOUNTER — Other Ambulatory Visit: Payer: Self-pay

## 2018-03-15 ENCOUNTER — Emergency Department (HOSPITAL_COMMUNITY): Payer: Medicaid Other

## 2018-03-15 ENCOUNTER — Encounter (HOSPITAL_COMMUNITY): Payer: Self-pay | Admitting: Emergency Medicine

## 2018-03-15 ENCOUNTER — Emergency Department (HOSPITAL_COMMUNITY)
Admission: EM | Admit: 2018-03-15 | Discharge: 2018-03-16 | Disposition: A | Discharge status: Home / Self Care | Payer: Medicaid Other | Attending: Emergency Medicine | Admitting: Emergency Medicine

## 2018-03-15 DIAGNOSIS — Z91040 Latex allergy status: Secondary | ICD-10-CM | POA: Diagnosis not present

## 2018-03-15 DIAGNOSIS — X509XXA Other and unspecified overexertion or strenuous movements or postures, initial encounter: Secondary | ICD-10-CM | POA: Insufficient documentation

## 2018-03-15 DIAGNOSIS — Y9301 Activity, walking, marching and hiking: Secondary | ICD-10-CM | POA: Insufficient documentation

## 2018-03-15 DIAGNOSIS — F17210 Nicotine dependence, cigarettes, uncomplicated: Secondary | ICD-10-CM | POA: Diagnosis not present

## 2018-03-15 DIAGNOSIS — Y92410 Unspecified street and highway as the place of occurrence of the external cause: Secondary | ICD-10-CM | POA: Diagnosis not present

## 2018-03-15 DIAGNOSIS — Z86011 Personal history of benign neoplasm of the brain: Secondary | ICD-10-CM | POA: Diagnosis not present

## 2018-03-15 DIAGNOSIS — Y999 Unspecified external cause status: Secondary | ICD-10-CM | POA: Insufficient documentation

## 2018-03-15 DIAGNOSIS — J45909 Unspecified asthma, uncomplicated: Secondary | ICD-10-CM | POA: Insufficient documentation

## 2018-03-15 DIAGNOSIS — M25571 Pain in right ankle and joints of right foot: Secondary | ICD-10-CM

## 2018-03-15 NOTE — ED Triage Notes (Signed)
Pt C/O right ankle pain that started after "stepping on something wrong."

## 2018-03-16 MED ORDER — IBUPROFEN 400 MG PO TABS
600.0000 mg | ORAL_TABLET | Freq: Once | ORAL | Status: DC
  Filled 2018-03-16: qty 2

## 2018-03-16 NOTE — Discharge Instructions (Addendum)
Elevate your leg. Take ibuprofen 600 mg 4 times a day for pain. Wear the ankle support to help keep your ankle from re injuring itself. Call Dr Harrision's office to get an appointment if you are still painful after a week. He is an Secondary school teacher.
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
## 2018-04-12 ENCOUNTER — Other Ambulatory Visit: Payer: Medicaid Other | Admitting: Adult Health

## 2018-04-12 ENCOUNTER — Encounter: Payer: Self-pay | Admitting: *Deleted

## 2018-04-24 ENCOUNTER — Encounter (HOSPITAL_COMMUNITY): Payer: Self-pay | Admitting: *Deleted

## 2018-04-24 ENCOUNTER — Emergency Department (HOSPITAL_COMMUNITY)
Admission: EM | Admit: 2018-04-24 | Discharge: 2018-04-24 | Disposition: A | Discharge status: Home / Self Care | Payer: Medicaid Other | Attending: Emergency Medicine | Admitting: Emergency Medicine

## 2018-04-24 DIAGNOSIS — N939 Abnormal uterine and vaginal bleeding, unspecified: Secondary | ICD-10-CM | POA: Insufficient documentation

## 2018-04-24 DIAGNOSIS — Z5321 Procedure and treatment not carried out due to patient leaving prior to being seen by health care provider: Secondary | ICD-10-CM | POA: Diagnosis not present

## 2018-04-24 LAB — TYPE AND SCREEN
ABO/RH(D): A POS
Antibody Screen: NEGATIVE

## 2018-04-24 LAB — CBC WITH DIFFERENTIAL/PLATELET
Basophils Absolute: 0 10*3/uL (ref 0.0–0.1)
Basophils Relative: 0 %
Eosinophils Absolute: 0.1 10*3/uL (ref 0.0–0.7)
Eosinophils Relative: 1 %
HCT: 44.1 % (ref 36.0–46.0)
Hemoglobin: 14.6 g/dL (ref 12.0–15.0)
Lymphocytes Relative: 35 %
Lymphs Abs: 3.7 10*3/uL (ref 0.7–4.0)
MCH: 27.4 pg (ref 26.0–34.0)
MCHC: 33.1 g/dL (ref 30.0–36.0)
MCV: 82.9 fL (ref 78.0–100.0)
Monocytes Absolute: 0.7 10*3/uL (ref 0.1–1.0)
Monocytes Relative: 6 %
Neutro Abs: 6.2 10*3/uL (ref 1.7–7.7)
Neutrophils Relative %: 58 %
Platelets: 249 10*3/uL (ref 150–400)
RBC: 5.32 MIL/uL — ABNORMAL HIGH (ref 3.87–5.11)
RDW: 13.2 % (ref 11.5–15.5)
WBC: 10.7 10*3/uL — ABNORMAL HIGH (ref 4.0–10.5)

## 2018-04-24 LAB — HCG, QUANTITATIVE, PREGNANCY: hCG, Beta Chain, Quant, S: 4 m[IU]/mL (ref <5)

## 2018-04-24 NOTE — ED Triage Notes (Signed)
Pt was a month late from last menses, pt with possible miscarriage with vaginal bleeding that has been heavy.  Unknown of blood clots have passed, per pt she went through 6 pads within 22-25 min. At one point today.

## 2018-04-24 NOTE — ED Notes (Signed)
Patient could not wait to be seen, informed her of number patient waiting to be seen, patient left AMA.

## 2018-05-04 ENCOUNTER — Encounter (HOSPITAL_COMMUNITY): Payer: Self-pay

## 2018-05-04 ENCOUNTER — Emergency Department (HOSPITAL_COMMUNITY)
Admission: EM | Admit: 2018-05-04 | Discharge: 2018-05-04 | Disposition: A | Discharge status: Other Acute Inpatient Hospital | Payer: Medicaid Other | Attending: Emergency Medicine | Admitting: Emergency Medicine

## 2018-05-04 ENCOUNTER — Encounter (HOSPITAL_COMMUNITY): Payer: Self-pay | Admitting: *Deleted

## 2018-05-04 ENCOUNTER — Other Ambulatory Visit: Payer: Self-pay

## 2018-05-04 ENCOUNTER — Inpatient Hospital Stay (HOSPITAL_COMMUNITY)
Admission: AD | Admit: 2018-05-04 | Discharge: 2018-05-08 | DRG: 885 | Disposition: A | Discharge status: Home / Self Care | Payer: Medicaid Other | Source: Intra-hospital | Attending: Psychiatry | Admitting: Psychiatry

## 2018-05-04 DIAGNOSIS — G4700 Insomnia, unspecified: Secondary | ICD-10-CM | POA: Diagnosis not present

## 2018-05-04 DIAGNOSIS — Z62810 Personal history of physical and sexual abuse in childhood: Secondary | ICD-10-CM

## 2018-05-04 DIAGNOSIS — Z590 Homelessness: Secondary | ICD-10-CM

## 2018-05-04 DIAGNOSIS — F17210 Nicotine dependence, cigarettes, uncomplicated: Secondary | ICD-10-CM | POA: Diagnosis present

## 2018-05-04 DIAGNOSIS — F332 Major depressive disorder, recurrent severe without psychotic features: Secondary | ICD-10-CM | POA: Insufficient documentation

## 2018-05-04 DIAGNOSIS — F419 Anxiety disorder, unspecified: Secondary | ICD-10-CM | POA: Diagnosis not present

## 2018-05-04 DIAGNOSIS — F410 Panic disorder [episodic paroxysmal anxiety] without agoraphobia: Secondary | ICD-10-CM | POA: Diagnosis present

## 2018-05-04 DIAGNOSIS — F319 Bipolar disorder, unspecified: Secondary | ICD-10-CM | POA: Diagnosis not present

## 2018-05-04 DIAGNOSIS — Z653 Problems related to other legal circumstances: Secondary | ICD-10-CM | POA: Diagnosis not present

## 2018-05-04 DIAGNOSIS — J45909 Unspecified asthma, uncomplicated: Secondary | ICD-10-CM | POA: Diagnosis not present

## 2018-05-04 DIAGNOSIS — F603 Borderline personality disorder: Secondary | ICD-10-CM | POA: Diagnosis not present

## 2018-05-04 DIAGNOSIS — Z79899 Other long term (current) drug therapy: Secondary | ICD-10-CM | POA: Insufficient documentation

## 2018-05-04 DIAGNOSIS — Z91040 Latex allergy status: Secondary | ICD-10-CM | POA: Diagnosis not present

## 2018-05-04 DIAGNOSIS — R45851 Suicidal ideations: Secondary | ICD-10-CM | POA: Insufficient documentation

## 2018-05-04 DIAGNOSIS — R4589 Other symptoms and signs involving emotional state: Secondary | ICD-10-CM | POA: Diagnosis present

## 2018-05-04 DIAGNOSIS — F3132 Bipolar disorder, current episode depressed, moderate: Secondary | ICD-10-CM

## 2018-05-04 DIAGNOSIS — F4310 Post-traumatic stress disorder, unspecified: Secondary | ICD-10-CM | POA: Diagnosis not present

## 2018-05-04 LAB — I-STAT BETA HCG BLOOD, ED (MC, WL, AP ONLY): I-stat hCG, quantitative: <5 m[IU]/mL (ref <5)

## 2018-05-04 LAB — CBC WITH DIFFERENTIAL/PLATELET
Basophils Absolute: 0.1 10*3/uL (ref 0.0–0.1)
Basophils Relative: 1 %
Eosinophils Absolute: 0.1 10*3/uL (ref 0.0–0.7)
Eosinophils Relative: 1 %
HCT: 39.9 % (ref 36.0–46.0)
Hemoglobin: 13.2 g/dL (ref 12.0–15.0)
Lymphocytes Relative: 36 %
Lymphs Abs: 3.2 10*3/uL (ref 0.7–4.0)
MCH: 27.4 pg (ref 26.0–34.0)
MCHC: 33.1 g/dL (ref 30.0–36.0)
MCV: 82.8 fL (ref 78.0–100.0)
Monocytes Absolute: 0.5 10*3/uL (ref 0.1–1.0)
Monocytes Relative: 6 %
Neutro Abs: 5.1 10*3/uL (ref 1.7–7.7)
Neutrophils Relative %: 56 %
Platelets: 269 10*3/uL (ref 150–400)
RBC: 4.82 MIL/uL (ref 3.87–5.11)
RDW: 13.3 % (ref 11.5–15.5)
WBC: 8.9 10*3/uL (ref 4.0–10.5)

## 2018-05-04 LAB — COMPREHENSIVE METABOLIC PANEL
ALT: 19 U/L (ref 0–44)
AST: 17 U/L (ref 15–41)
Albumin: 3.9 g/dL (ref 3.5–5.0)
Alkaline Phosphatase: 77 U/L (ref 38–126)
Anion gap: 8 (ref 5–15)
BUN: 10 mg/dL (ref 6–20)
CO2: 24 mmol/L (ref 22–32)
Calcium: 9 mg/dL (ref 8.9–10.3)
Chloride: 107 mmol/L (ref 98–111)
Creatinine, Ser: 0.52 mg/dL (ref 0.44–1.00)
GFR calc Af Amer: >60 mL/min (ref >60)
GFR calc non Af Amer: >60 mL/min (ref >60)
Glucose, Bld: 94 mg/dL (ref 70–99)
Potassium: 3.4 mmol/L — ABNORMAL LOW (ref 3.5–5.1)
Sodium: 139 mmol/L (ref 135–145)
Total Bilirubin: 0.6 mg/dL (ref 0.3–1.2)
Total Protein: 7.3 g/dL (ref 6.5–8.1)

## 2018-05-04 LAB — URINALYSIS, ROUTINE W REFLEX MICROSCOPIC
Bacteria, UA: NONE SEEN
Bilirubin Urine: NEGATIVE
Glucose, UA: NEGATIVE mg/dL
Hgb urine dipstick: NEGATIVE
Ketones, ur: NEGATIVE mg/dL
Leukocytes, UA: NEGATIVE
Nitrite: NEGATIVE
Protein, ur: NEGATIVE mg/dL
Specific Gravity, Urine: 1.025 (ref 1.005–1.030)
pH: 6 (ref 5.0–8.0)

## 2018-05-04 LAB — PREGNANCY, URINE: Preg Test, Ur: NEGATIVE

## 2018-05-04 LAB — ETHANOL: Alcohol, Ethyl (B): <10 mg/dL (ref <10)

## 2018-05-04 LAB — SALICYLATE LEVEL: Salicylate Lvl: <7 mg/dL (ref 2.8–30.0)

## 2018-05-04 LAB — ACETAMINOPHEN LEVEL: Acetaminophen (Tylenol), Serum: <10 ug/mL — ABNORMAL LOW (ref 10–30)

## 2018-05-04 MED ORDER — LURASIDONE HCL 40 MG PO TABS
20.0000 mg | ORAL_TABLET | Freq: Every day | ORAL | Status: DC
  Administered 2018-05-04: 20 mg via ORAL
  Filled 2018-05-04 (×2): qty 1

## 2018-05-04 MED ORDER — TRAZODONE HCL 50 MG PO TABS: 50.0000 mg | ORAL_TABLET | Freq: Every evening | ORAL | Status: DC | PRN

## 2018-05-04 MED ORDER — HYDROXYZINE HCL 25 MG PO TABS
25.0000 mg | ORAL_TABLET | Freq: Four times a day (QID) | ORAL | Status: DC | PRN
  Administered 2018-05-04 – 2018-05-06 (×3): 25 mg via ORAL
  Filled 2018-05-04 (×4): qty 1

## 2018-05-04 MED ORDER — IBUPROFEN 400 MG PO TABS: 400.0000 mg | ORAL_TABLET | Freq: Three times a day (TID) | ORAL | Status: DC | PRN

## 2018-05-04 MED ORDER — LURASIDONE HCL 20 MG PO TABS: 20.0000 mg | ORAL_TABLET | Freq: Every day | ORAL | Status: DC

## 2018-05-04 MED ORDER — ACETAMINOPHEN 325 MG PO TABS: 650.0000 mg | ORAL_TABLET | Freq: Four times a day (QID) | ORAL | Status: DC | PRN

## 2018-05-04 MED ORDER — ALUM & MAG HYDROXIDE-SIMETH 200-200-20 MG/5ML PO SUSP: 30.0000 mL | ORAL | Status: DC | PRN

## 2018-05-04 MED ORDER — HYDROXYZINE HCL 25 MG PO TABS: 25.0000 mg | ORAL_TABLET | Freq: Three times a day (TID) | ORAL | Status: DC | PRN

## 2018-05-04 MED ORDER — BUSPIRONE HCL 5 MG PO TABS: 15.0000 mg | ORAL_TABLET | Freq: Three times a day (TID) | ORAL | Status: DC

## 2018-05-04 MED ORDER — MAGNESIUM HYDROXIDE 400 MG/5ML PO SUSP: 30.0000 mL | Freq: Every day | ORAL | Status: DC | PRN

## 2018-05-04 MED ORDER — LURASIDONE HCL 20 MG PO TABS
20.0000 mg | ORAL_TABLET | Freq: Every day | ORAL | Status: DC
  Filled 2018-05-04: qty 1

## 2018-05-04 NOTE — Progress Notes (Signed)
Patient refuses nicotine patch because she is allergic to it.  She states she is also allergic to the nicorette gum.

## 2018-05-04 NOTE — ED Provider Notes (Signed)
[facility] Provider Note   CSN: [id number] Arrival date & time: 05/04/18  0016     History   Chief Complaint Chief Complaint  Patient presents with  . V70.1    HPI Tina Wood is a 27 y.o. female.  The history is provided by the patient.  Mental Health Problem  Presenting symptoms: suicidal thoughts   Degree of incapacity (severity):  Moderate Onset quality:  Gradual Timing:  Constant Progression:  Worsening Chronicity:  New Context: stressful life event   Relieved by:  Nothing Worsened by:  Nothing Associated symptoms: no chest pain and no headaches   Patient with history of asthma and seizures presents for suicidal thoughts.  She reports having a panic attack and a falling out with family, she began having thoughts of harm herself.  She denies any actual attempt to harm herself.  She denies overdose.  She denies drug use. Confirms continued thoughts of harming herself  Past Medical History:  Diagnosis Date  . Asthma   . Brain tumor (benign) ([place])   . Seizures [facility])     Patient Active Problem List   Diagnosis Date Noted  . Missed periods 03/12/2018  . Pregnancy examination or test, negative result 03/12/2018  . History of seizures 03/12/2018  . Encounter for IUD removal 03/16/2017    Past Surgical History:  Procedure Laterality Date  . SKIN GRAFT    . WRIST SURGERY       OB History    Gravida  5   Para  1   Term  1   Preterm      AB  4   Living  1     SAB  4   TAB      Ectopic      Multiple      Live Births               Home Medications    Prior to Admission medications   Medication Sig Start Date End Date Taking? Authorizing Provider  busPIRone (BUSPAR) 15 MG tablet Take 15 mg by mouth 3 (three) times daily.    [provider]  lurasidone (LATUDA) 20 MG TABS tablet Take 20 mg by mouth at bedtime.    [provider]  Prenatal MV-Min-FA-Omega-3 (PRENATAL GUMMIES/DHA & FA) 0.4-32.5  MG CHEW Chew 2 tablets by mouth daily. 03/12/18   Estill Dooms, NP    Family History History reviewed. No pertinent family history.  Social History Social History   Tobacco Use  . Smoking status: Current Every Day Smoker    Packs/day: 3.00    Years: 9.00    Pack years: 27.00    Types: Cigarettes  . Smokeless tobacco: Former Systems developer    Types: Snuff  Substance Use Topics  . Alcohol use: No  . Drug use: No     Allergies   Cherry; Coconut flavor; Pomegranate [punica]; Bee venom; Nicotine; Grass extracts [gramineae pollens]; Latex; Shellfish allergy; and Tape   Review of Systems Review of Systems  Constitutional: Negative for fever.  Cardiovascular: Negative for chest pain.  Neurological: Negative for headaches.  Psychiatric/Behavioral: Positive for suicidal ideas.  All other systems reviewed and are negative.    Physical Exam Updated Vital Signs BP 108/74 (BP Location: Right Wrist)   Pulse 78   Temp 98.2 F (36.8 C) (Oral)   Resp 14   Ht 1.702 m (5\' 7" )   Wt 117.9 kg   LMP 04/24/2018 (Approximate) Comment: pt states she  may be pregnant  SpO2 96%   BMI 40.72 kg/m   Physical Exam  CONSTITUTIONAL: Well developed/well nourished, sleeping and in no distress HEAD: Normocephalic/atraumatic EYES: EOMI/PERRL ENMT: Mucous membranes moist NECK: supple no meningeal signs CV: S1/S2 noted, no murmurs/rubs/gallops noted LUNGS: Lungs are clear to auscultation bilaterally, no apparent distress ABDOMEN: soft, nontender NEURO: Pt is awake/alert/appropriate, moves all extremitiesx4.  No facial droop.   EXTREMITIES: pulses normal/equal, full ROM SKIN: warm, color normal PSYCH: no abnormalities of mood noted, alert and oriented to situation  ED Treatments / Results  Labs (all labs ordered are listed, but only abnormal results are displayed) Labs Reviewed  COMPREHENSIVE METABOLIC PANEL - Abnormal; Notable for the following components:      Result Value   Potassium 3.4  (*)    All other components within normal limits  ACETAMINOPHEN LEVEL - Abnormal; Notable for the following components:   Acetaminophen (Tylenol), Serum <10 (*)    All other components within normal limits  CBC WITH DIFFERENTIAL/PLATELET  ETHANOL  SALICYLATE LEVEL  RAPID URINE DRUG SCREEN, HOSP PERFORMED  I-STAT BETA HCG BLOOD, ED ([facility], [facility], [facility] ONLY)    EKG None  Radiology No results found.  Procedures Procedures    Medications Ordered in ED Medications  lurasidone (LATUDA) tablet 20 mg (has no administration in time range)  busPIRone (BUSPAR) tablet 15 mg (has no administration in time range)  ibuprofen (ADVIL,MOTRIN) tablet 400 mg (has no administration in time range)     Initial Impression / Assessment and Plan / ED Course  I have reviewed the triage vital signs and the nursing notes.  Pertinent labs  results that were available during my care of the patient were reviewed by me and considered in my medical decision making (see chart for details).     3:00 AM Labs reassuring.  Patient resting comfortably.  Home meds ordered.  We will consult psychiatry for placement  Final Clinical Impressions(s) / ED Diagnoses   Final diagnoses:  Suicidal thoughts    ED Discharge Orders    None       Ripley Fraise, MD 05/04/18 0300

## 2018-05-04 NOTE — ED Notes (Signed)
Pt very agitated and upset when notified she was going to [facility] this morning. Pt states, "I'm not going to go. I will lose my child to DSS and I will lose my job if I don't go in today". Pt becoming very irate with RN. Pt requesting to speak with "someone". EDP notified. [place] called to have someone speak to pt over the phone per pt's request. [facility] speaking with pt over the phone currently.

## 2018-05-04 NOTE — Progress Notes (Signed)
Did not attend group 

## 2018-05-04 NOTE — ED Notes (Addendum)
Pt's personal clothing, boots, and cell phone removed from pt. Pt placed in burgundy psych scrubs. Security wanded pt as well.

## 2018-05-04 NOTE — ED Notes (Signed)
Inpatient placement recommended, MD notified
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
## 2018-05-04 NOTE — Progress Notes (Signed)
Per Lindon Romp, NP pt is recommended for inpt treatment. EDP Ripley Fraise, MD and pt's nurse Jovita Kussmaul, RN have been advised. [facility] reviewing for possible admission.   Lind Covert, MSW, LCSW Therapeutic Triage Specialist  [phone]
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
## 2018-05-04 NOTE — ED Notes (Addendum)
We are waiting for IVC paperwork to be served prior to transporting pt to [facility]. Hoyle Sauer, [facility] RN aware.

## 2018-05-04 NOTE — ED Notes (Signed)
Pt's brother called RN and stated that pt was texting him this morning saying that "no one cared for her and she was ready to die". RN went into pt's room and pt was found using her cell phone. Pt was informed that her belongings would have to be removed and placed into a locker.

## 2018-05-04 NOTE — Tx Team (Signed)
Initial Treatment Plan 05/04/2018 2:43 PM [name] SWN:[id number]    PATIENT STRESSORS: Financial difficulties Occupational concerns   PATIENT STRENGTHS: Average or above average intelligence Communication skills Supportive family/friends Work skills   PATIENT IDENTIFIED PROBLEMS: "I don't even know why I'm here"  "I threw a bottle of pills at my brother and he called the sheriff"  "I wanted him to take my pills away so I wouldn't take them all."  Poor insight regarding her mental health  Multiple hospitalizations  Suicidal ideation  Homelessness         DISCHARGE CRITERIA:  Adequate post-discharge living arrangements Motivation to continue treatment in a less acute level of care Safe-care adequate arrangements made  PRELIMINARY DISCHARGE PLAN: Outpatient therapy Return to previous work or school arrangements  PATIENT/FAMILY INVOLVEMENT: This treatment plan has been presented to and reviewed with the patient, Tina Wood.  The patient and family have been given the opportunity to ask questions and make suggestions.  Zipporah Plants, RN 05/04/2018, 2:43 PM

## 2018-05-04 NOTE — ED Provider Notes (Signed)
Pt making statements she is going to leave, not staying/getting admitted, etc. [facility] has spoken with pt on telephone. Pt continues to be agitated and refusing to stay. IVC paperwork completed. Pt already accepted to [facility].    Francine Graven, DO 05/04/18 [phone]

## 2018-05-04 NOTE — ED Notes (Signed)
Pt accepted to [facility] at 0800, 401 Bed 2 For report: [phone], Dr. Parke Poisson

## 2018-05-04 NOTE — [facility] Counselor (Signed)
Spoke with pt at request of pt's nurse. Pt upset & angry that she is being admitted to [facility]. Pt initially denied telling counselor she was suicidal, just that she asked her brother to remove meds bc she had impulse to OD. It was explained to pt that her SI with plan and previous hx of attempts indicated she was in current need of inpatient treatment. Pt reported multiple conflicts with admission. Pt gave permission to speak with her best friend (her brother) but states we cannot reach him.

## 2018-05-04 NOTE — Progress Notes (Signed)
Admission note:  Patient is a 27 yo female who is involuntarily committed.  Patient was admitted to [facility] after telling her brother to take all of her medication because she felt like she would overdose on it.  Patient states she had attempted suicide in the past and has had multiple admissions.  She would not name where.  This is her first admission at [facility].  She was involuntarily committed because she was wanting to leave.  Patient is currently living with her brother, however, states she cannot stay there and she is homeless.  She states her first day of work was yesterday at [employer] and her employer knows that she is here.  She denies any substance abuse.  She has abstained from alcohol for 11 years.  She denies any drug use. Her medical hx includes asthma, a benign brain tumor, and seizures.  She states her last seizure was in June.  She does not take any medication for her seizures.  Patient reports anxiety attacks. She is a smoker, however, is allergic to the nicotine patch and the gum.  She has a child, however, they are in [place] custody. Patient is irritable and does not feel she needs to be here.  She has asked for buspar for her anxiety.  She states she is pregnant, however, her lab results state differently.  Ordered a pregnancy and urinalysis.  Patient was oriented to unit and room.  Patient's skin assessment revealed multiple scars to her left wrist due surgery when she was a child.

## 2018-05-04 NOTE — ED Triage Notes (Addendum)
Pt states SI beginning around 2100 05/03/18 after having a panic attack and falling out with family members, states she had brother take her medications (aniety/bipolar) because she had thoughts of harming herself. Denies wanting to commit suicide/actually wanting to die.

## 2018-05-04 NOTE — Progress Notes (Signed)
Pt accepted to [facility] 401-2 at 08:00. Attending provider will be Dr. Parke Poisson, MD. Call to report [phone]. Jovita Kussmaul, RN has been advised.   Lind Covert, MSW, LCSW Therapeutic Triage Specialist  [phone]
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
## 2018-05-05 LAB — LIPID PANEL
Cholesterol: 143 mg/dL (ref 0–200)
HDL: 35 mg/dL — ABNORMAL LOW (ref >40)
LDL Cholesterol: 90 mg/dL (ref 0–99)
Total CHOL/HDL Ratio: 4.1 RATIO
Triglycerides: 90 mg/dL (ref <150)
VLDL: 18 mg/dL (ref 0–40)

## 2018-05-05 LAB — HEMOGLOBIN A1C
Hgb A1c MFr Bld: 5.4 % (ref 4.8–5.6)
Mean Plasma Glucose: 108.28 mg/dL

## 2018-05-05 LAB — TSH: TSH: 0.824 u[IU]/mL (ref 0.350–4.500)

## 2018-05-05 MED ORDER — ASPIRIN-ACETAMINOPHEN-CAFFEINE 250-250-65 MG PO TABS
1.0000 | ORAL_TABLET | Freq: Once | ORAL | Status: AC
  Administered 2018-05-05: 1 via ORAL
  Filled 2018-05-05 (×2): qty 1

## 2018-05-05 MED ORDER — LURASIDONE HCL 40 MG PO TABS
40.0000 mg | ORAL_TABLET | Freq: Every day | ORAL | Status: DC
  Filled 2018-05-05: qty 1

## 2018-05-05 MED ORDER — LURASIDONE HCL 40 MG PO TABS
40.0000 mg | ORAL_TABLET | Freq: Every day | ORAL | Status: DC
  Administered 2018-05-05 – 2018-05-08 (×4): 40 mg via ORAL
  Filled 2018-05-05 (×7): qty 1

## 2018-05-05 NOTE — Progress Notes (Signed)
[facility] MD Progress Note  05/05/2018 10:36 AM Valentina Alcoser  MRN:  [id number] Subjective:  Reports partially improved mood compared to how she felt prior to admission, states " I am not feeling irritable anymore". Tends to ruminate about stressors, and today expresses irritation and sadness  about her brother, whom she identifies as closest support, but who " was supposed to visit me yesterday and bring me clothes and did not come ". Denies medication side effects and feels Latuda, which had recently been started, has been effective thus far . Today denies suicidal ideations. Objective : I have reviewed chart notes and have met with patient . 27 year old female.  Presented to ED with suicidal ideations of overdosing.  She reports recently increased depression and severe anxiety, with frequent panic attacks.  She reports significant psychosocial stressors- starting new job, signing document recently ceding parental rights of her child, having relationship difficulties with her SO. At this time reports partial improvement, but as above, remains vaguely depressed, ruminative and expressing sadness /anger that her brother did not come to visit her. Denies suicidal ideations and contracts for safety on unit . Tolerating Latuda trial well thus far, denies side effects. No disruptive or overtly agitated behaviors on unit.  Labs reviewed - TSH 0.824, HgbA1C 5.4    Principal Problem:  Bipolar Disorder, PTSD, also reports prior diagnosis of  Borderline Personality Disorder  Diagnosis:   Patient Active Problem List   Diagnosis Date Noted  . Severe recurrent major depression without psychotic features ([place]) [F33.2] 05/04/2018  . Missed periods [N92.6] 03/12/2018  . Pregnancy examination or test, negative result [Z32.02] 03/12/2018  . History of seizures [Z87.898] 03/12/2018  . Encounter for IUD removal [Z30.432] 03/16/2017   Total Time spent with patient: 20 minutes  Past Psychiatric History:   Past  Medical History:  Past Medical History:  Diagnosis Date  . Asthma   . Brain tumor (benign) ([place])   . Seizures ([place])     Past Surgical History:  Procedure Laterality Date  . SKIN GRAFT    . WRIST SURGERY     Family History: History reviewed. No pertinent family history. Family Psychiatric  History: Social History:  Social History   Substance and Sexual Activity  Alcohol Use No     Social History   Substance and Sexual Activity  Drug Use No    Social History   Socioeconomic History  . Marital status: Single    Spouse name: Not on file  . Number of children: Not on file  . Years of education: Not on file  . Highest education level: Not on file  Occupational History  . Not on file  Social Needs  . Financial resource strain: Not on file  . Food insecurity:    Worry: Not on file    Inability: Not on file  . Transportation needs:    Medical: Not on file    Non-medical: Not on file  Tobacco Use  . Smoking status: Current Every Day Smoker    Packs/day: 3.00    Years: 9.00    Pack years: 27.00    Types: Cigarettes  . Smokeless tobacco: Former Systems developer    Types: Snuff  Substance and Sexual Activity  . Alcohol use: No  . Drug use: No  . Sexual activity: Yes    Birth control/protection: None  Lifestyle  . Physical activity:    Days per week: Not on file    Minutes per session: Not on file  . Stress:  Not on file  Relationships  . Social connections:    Talks on phone: Not on file    Gets together: Not on file    Attends religious service: Not on file    Active member of club or organization: Not on file    Attends meetings of clubs or organizations: Not on file    Relationship status: Not on file  Other Topics Concern  . Not on file  Social History Narrative  . Not on file   Additional Social History:   Sleep: Fair  Appetite:  Fair  Current Medications: Current Facility-Administered Medications  Medication Dose Route Frequency Provider Last Rate Last  Dose  . acetaminophen (TYLENOL) tablet 650 mg  650 mg Oral Q6H PRN Lindon Romp A, NP      . alum & mag hydroxide-simeth (MAALOX/MYLANTA) 200-200-20 MG/5ML suspension 30 mL  30 mL Oral Q4H PRN Lindon Romp A, NP      . hydrOXYzine (ATARAX/VISTARIL) tablet 25 mg  25 mg Oral Q6H PRN Cobos, Myer Peer, MD   25 mg at 05/05/18 0947  . lurasidone (LATUDA) tablet 40 mg  40 mg Oral q1800 Cobos, Fernando A, MD      . magnesium hydroxide (MILK OF MAGNESIA) suspension 30 mL  30 mL Oral Daily PRN Lindon Romp A, NP      . traZODone (DESYREL) tablet 50 mg  50 mg Oral QHS PRN Rozetta Nunnery, NP        Lab Results:  Results for orders placed or performed during the hospital encounter of 05/04/18 (from the past 48 hour(s))  Pregnancy, urine     Status: None   Collection Time: 05/04/18 12:15 PM  Result Value Ref Range   Preg Test, Ur NEGATIVE NEGATIVE    Comment:        THE SENSITIVITY OF THIS METHODOLOGY IS >20 mIU/mL. Performed at [facility], [place] [address], [place], [place] [address]   Urinalysis, Routine w reflex microscopic     Status: Abnormal   Collection Time: 05/04/18 12:15 PM  Result Value Ref Range   Color, Urine YELLOW YELLOW   APPearance TURBID (A) CLEAR   Specific Gravity, Urine 1.025 1.005 - 1.030   pH 6.0 5.0 - 8.0   Glucose, UA NEGATIVE NEGATIVE mg/dL   Hgb urine dipstick NEGATIVE NEGATIVE   Bilirubin Urine NEGATIVE NEGATIVE   Ketones, ur NEGATIVE NEGATIVE mg/dL   Protein, ur NEGATIVE NEGATIVE mg/dL   Nitrite NEGATIVE NEGATIVE   Leukocytes, UA NEGATIVE NEGATIVE   RBC / HPF 0-5 0 - 5 RBC/hpf   WBC, UA 0-5 0 - 5 WBC/hpf   Bacteria, UA NONE SEEN NONE SEEN   Squamous Epithelial / LPF 6-10 0 - 5   Mucus PRESENT    Amorphous Crystal PRESENT     Comment: Performed at [facility], [place] [address] [place], [place] [address]  Hemoglobin A1c     Status: None   Collection Time: 05/05/18  6:27 AM  Result Value Ref Range   Hgb A1c MFr Bld 5.4 4.8  - 5.6 %    Comment: (NOTE) Pre diabetes:          5.7%-6.4% Diabetes:              >6.4% Glycemic control for   <7.0% adults with diabetes    Mean Plasma Glucose 108.28 mg/dL    Comment: Performed at [place] [address], [place], [place] [address]  Lipid panel  Status: Abnormal   Collection Time: 05/05/18  6:27 AM  Result Value Ref Range   Cholesterol 143 0 - 200 mg/dL   Triglycerides 90 <150 mg/dL   HDL 35 (L) >40 mg/dL   Total CHOL/HDL Ratio 4.1 RATIO   VLDL 18 0 - 40 mg/dL   LDL Cholesterol 90 0 - 99 mg/dL    Comment:        Total Cholesterol/HDL:CHD Risk Coronary Heart Disease Risk Table                     Men   Women  1/2 Average Risk   3.4   3.3  Average Risk       5.0   4.4  2 X Average Risk   9.6   7.1  3 X Average Risk  23.4   11.0        Use the calculated Patient Ratio above and the CHD Risk Table to determine the patient's CHD Risk.        ATP III CLASSIFICATION (LDL):  <100     mg/dL   Optimal  100-129  mg/dL   Near or Above                    Optimal  130-159  mg/dL   Borderline  160-189  mg/dL   High  >190     mg/dL   Very High Performed at [facility] [address] [place], [place] [address]   TSH     Status: None   Collection Time: 05/05/18  6:27 AM  Result Value Ref Range   TSH 0.824 0.350 - 4.500 uIU/mL    Comment: Performed by a 3rd Generation assay with a functional sensitivity of <=0.01 uIU/mL. Performed at [facility], [place] [address] [place], [place] [address]     Blood Alcohol level:  Lab Results  Component Value Date   ETH <10 08/31/7251    Metabolic Disorder Labs: Lab Results  Component Value Date   HGBA1C 5.4 05/05/2018   MPG 108.28 05/05/2018   No results found for: PROLACTIN Lab Results  Component Value Date   CHOL 143 05/05/2018   TRIG 90 05/05/2018   HDL 35 (L) 05/05/2018   CHOLHDL 4.1 05/05/2018   VLDL 18 05/05/2018   LDLCALC 90 05/05/2018    Physical  Findings: AIMS: Facial and Oral Movements Muscles of Facial Expression: None, normal Lips and Perioral Area: None, normal Jaw: None, normal Tongue: None, normal,Extremity Movements Upper (arms, wrists, hands, fingers): None, normal Lower (legs, knees, ankles, toes): None, normal, Trunk Movements Neck, shoulders, hips: None, normal, Overall Severity Severity of abnormal movements (highest score from questions above): None, normal Incapacitation due to abnormal movements: None, normal Patient's awareness of abnormal movements (rate only patient's report): No Awareness, Dental Status Current problems with teeth and/or dentures?: No Does patient usually wear dentures?: No  CIWA:    COWS:     Musculoskeletal: Strength & Muscle Tone: within normal limits Gait & Station: normal Patient leans: N/A  Psychiatric Specialty Exam: Physical Exam  ROS no chest pain, no shortness of breath, no vomiting  Blood pressure 104/65, pulse 76, temperature 98.6 F (37 C), temperature source Oral, resp. rate 18, height '5\' 7"'$  (1.702 m), weight 113.4 kg, last menstrual period 04/24/2018, SpO2 100 %.Body mass index is 39.16 kg/m.  General Appearance: improving grooming   Eye Contact:  Good  Speech:  Normal Rate  Volume:  Normal  Mood:  reports some improvement , remains depressed   Affect:  constricted, smiles briefly at times, less irritable   Thought Process:  Linear and Descriptions of Associations: Intact  Orientation:  Full (Time, Place, and Person)  Thought Content:  denies hallucinations, no delusions, not internally preoccupied   Suicidal Thoughts:  No denies suicidal or self injurious ideations, contracts for safety on unit at this time  Homicidal Thoughts:  No denies homicidal or violent ideations  Memory:  recent and remote grossly intact   Judgement:  Fair- improving   Insight:  Fair  Psychomotor Activity:  Decreased  Concentration:  Concentration: Good and Attention Span: Good  Recall:   Good  Fund of Knowledge:  Good  Language:  Good  Akathisia:  Negative  Handed:  Right  AIMS (if indicated):     Assets:  Desire for Improvement Resilience  ADL's:  Intact  Cognition:  WNL  Sleep:  Number of Hours: 6.25   Assessment- 26 year old female, reports prior history of Bipolar Disorder and Borderline Personality Disorder diagnosis, presented to hospital for worsening depression, anxiety, suicidal ideations in the context of significant psychosocial stressors as above . Today reports some improvement compared to how she felt prior to admission and affect presents somewhat more reactive and less irritable. Remains sad, vaguely dysphoric, ruminative, today particularly about her brother not having come to visit her last night.  Denies SI, contracts for safety. Tolerating Latuda well, today states prefers AM dosing rather than QHS- agrees to further medication dose titration, side effects reviewed .   Treatment Plan Summary: Daily contact with patient to assess and evaluate symptoms and progress in treatment, Medication management, Plan inpatient treatment and medications as below Encourage group and milieu participation  Increase Latuda to 40 mgrs QAM for mood disorder  Continue Vistaril 25 mgrs Q 6 hours PRN for anxiety as needed  Continue Trazodone 50 mgrs QHS PRN for insomnia as needed  Treatment team working on disposition planning options  Jenne Campus, MD 05/05/2018, 10:36 AM

## 2018-05-05 NOTE — Progress Notes (Signed)
Writer spoke with patient 1:1 and she was crying and upset about being the power of attorney for her brother whom she reports is not really her brother. She reports that she does not want to go back there and live after discharged. She talked about the situation for a little while and then reported that she was done with it since he has not been to visit the last 2 nights. She reported that she had a migraine and requested Excedrin. Writer encouraged her to speak with her social worker about this situation on tomorrow and try to focus on herself while here. Safety maintained on unit with 15 min checks. and patient will receive medication for migraine once order received.

## 2018-05-05 NOTE — BHH Group Notes (Signed)
[facility] Group Notes:  (Nursing)  Date:  05/05/2018  Time: 1:15 PM Type of Therapy:  Nurse Education  Participation Level:  Did Not Attend   Tina Wood 05/05/2018, 2:17 PM
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
## 2018-05-05 NOTE — BHH Group Notes (Signed)
LCSW Group Therapy Note  05/05/2018   10:00-11:00am   Type of Therapy and Topic:  Group Therapy: Anger Cues and Responses  Participation Level:  Active   Description of Group:   In this group, patients learned how to recognize the physical, cognitive, emotional, and behavioral responses they have to anger-provoking situations.  They identified a recent time they became angry and how they reacted.  They analyzed how their reaction was possibly beneficial and how it was possibly unhelpful.  The group discussed a variety of healthier coping skills that could help with such a situation in the future.  Deep breathing was practiced briefly.  Therapeutic Goals: 1. Patients will remember their last incident of anger and how they felt emotionally and physically, what their thoughts were at the time, and how they behaved. 2. Patients will identify how their behavior at that time worked for them, as well as how it worked against them. 3. Patients will explore possible new behaviors to use in future anger situations. 4. Patients will learn that anger itself is normal and cannot be eliminated, and that healthier reactions can assist with resolving conflict rather than worsening situations.  Summary of Patient Progress:  The patient shared that her most recent episode of anger started when she did not receive the response from her "brother" when described experiencing a hard time on first day on the job. She stated that she felt like hurting herself  And asked to brought to the hospital. She verbalized awareness of her emotional and physical cues associated with anger. She reported that she already utilizes many effective skills now including journaling, listening to music and coloring.  Therapeutic Modalities:   Cognitive Behavioral Therapy  Rolanda Jay

## 2018-05-05 NOTE — Progress Notes (Signed)
D. Pt presents with an anxious affect - somewhat childlike, but friendly behavior. Pt reports an improving mood today, but is still very anxious. Per pt's self inventory, pt rates her depression, hopelessness and anxiety a 2/0/10, respectively. Pt writes that her most important goal today is "managing my anxiety" and writes that she will "take my meds" to help her meet her goal. Pt currently denies SI/HI and AVH  A. Labs and vitals monitored. Pt compliant with medications. Pt supported emotionally and encouraged to express concerns and ask questions.   R. Pt remains safe with 15 minute checks. Will continue POC.
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
## 2018-05-06 DIAGNOSIS — F319 Bipolar disorder, unspecified: Secondary | ICD-10-CM

## 2018-05-06 DIAGNOSIS — F17210 Nicotine dependence, cigarettes, uncomplicated: Secondary | ICD-10-CM

## 2018-05-06 DIAGNOSIS — F603 Borderline personality disorder: Secondary | ICD-10-CM

## 2018-05-06 DIAGNOSIS — F4310 Post-traumatic stress disorder, unspecified: Secondary | ICD-10-CM

## 2018-05-06 MED ORDER — DIPHENHYDRAMINE HCL 25 MG PO CAPS
ORAL_CAPSULE | ORAL | Status: AC
  Filled 2018-05-06: qty 2

## 2018-05-06 MED ORDER — HALOPERIDOL LACTATE 5 MG/ML IJ SOLN: 5.0000 mg | Freq: Four times a day (QID) | INTRAMUSCULAR | Status: DC | PRN

## 2018-05-06 MED ORDER — LORAZEPAM 1 MG PO TABS: 1.0000 mg | ORAL_TABLET | Freq: Once | ORAL | Status: DC | PRN

## 2018-05-06 MED ORDER — LORAZEPAM 1 MG PO TABS
ORAL_TABLET | ORAL | Status: AC
  Filled 2018-05-06: qty 2

## 2018-05-06 MED ORDER — DIPHENHYDRAMINE HCL 50 MG/ML IJ SOLN: 50.0000 mg | Freq: Four times a day (QID) | INTRAMUSCULAR | Status: DC | PRN

## 2018-05-06 MED ORDER — DIPHENHYDRAMINE HCL 25 MG PO CAPS
50.0000 mg | ORAL_CAPSULE | Freq: Four times a day (QID) | ORAL | Status: DC | PRN
  Administered 2018-05-06: 50 mg via ORAL

## 2018-05-06 MED ORDER — LORAZEPAM 2 MG/ML IJ SOLN: 2.0000 mg | Freq: Four times a day (QID) | INTRAMUSCULAR | Status: DC | PRN

## 2018-05-06 MED ORDER — HALOPERIDOL 5 MG PO TABS
5.0000 mg | ORAL_TABLET | Freq: Four times a day (QID) | ORAL | Status: DC | PRN
  Administered 2018-05-06: 5 mg via ORAL

## 2018-05-06 MED ORDER — HALOPERIDOL 5 MG PO TABS
ORAL_TABLET | ORAL | Status: AC
  Filled 2018-05-06: qty 1

## 2018-05-06 MED ORDER — LORAZEPAM 1 MG PO TABS
2.0000 mg | ORAL_TABLET | Freq: Four times a day (QID) | ORAL | Status: DC | PRN
  Administered 2018-05-06: 2 mg via ORAL

## 2018-05-06 NOTE — Progress Notes (Addendum)
[facility] MD Progress Note  05/06/2018 3:32 PM Tina Wood  MRN:  [id number] Subjective: I had a panic attack today.  My grandparents Called me worthless and that because my anxiety to get worse.  My grandpa had stopped all of my close and I still do not have them yet.  The scrubs and making me each and no one is wanting to help me.  I am about to lose my job when a more going to leave.  My family is leaving me and moving out.  They are leaving me with over $2000 and feels and I am going to lose my job.  Objective : I have reviewed chart notes and have met with patient . 27 year old female.  Presented to ED with suicidal ideations of overdosing.  She reports recently increased depression and severe anxiety, with frequent panic attacks.    Patient appears to be regressing since admission, as she is observed with increase psychomotor agitation, anxiety.  Despite physical symptoms and clinical.  Patient denies any anxiety or depression at this time.  She rates both her depression and anxiety is 0 out of 10, with 10 being the worst.  It is apparent that patient is minimizing her symptoms and efforts to speed up her discharge due to upcoming court dates.  It is also apparent that patient has a significant amount of stressors that could lead to relapse and readmission, and will continue to monitor her physical and clinical appearance.  Patient is very preoccupied on her new job and upcoming court dates."  My family is leaving me with over $2000 and feels and I just lost a new job.  If I do not believe here soon I am going to lose my new job and be put out of my home."Patient states she has 3 Upcoming Ct[address] date on September 12, September 18, in September 19; with parents court date being for daughter custody, probation, and false police report all consecutively.  Denies suicidal ideations and contracts for safety on unit . Tolerating Latuda trial well thus far, denies side effects. No disruptive or overtly  agitated behaviors on unit.  Labs reviewed - TSH 0.824, HgbA1C 5.4    Principal Problem:  Bipolar Disorder, PTSD, also reports prior diagnosis of  Borderline Personality Disorder  Diagnosis:   Patient Active Problem List   Diagnosis Date Noted  . Severe recurrent major depression without psychotic features ([place]) [F33.2] 05/04/2018  . Missed periods [N92.6] 03/12/2018  . Pregnancy examination or test, negative result [Z32.02] 03/12/2018  . History of seizures [Z87.898] 03/12/2018  . Encounter for IUD removal [Z30.432] 03/16/2017   Total Time spent with patient: 20 minutes  Past Psychiatric History:   Past Medical History:  Past Medical History:  Diagnosis Date  . Asthma   . Brain tumor (benign) ([place])   . Seizures ([place])     Past Surgical History:  Procedure Laterality Date  . SKIN GRAFT    . WRIST SURGERY     Family History: History reviewed. No pertinent family history. Family Psychiatric  History: Social History:  Social History   Substance and Sexual Activity  Alcohol Use No     Social History   Substance and Sexual Activity  Drug Use No    Social History   Socioeconomic History  . Marital status: Single    Spouse name: Not on file  . Number of children: Not on file  . Years of education: Not on file  . Highest education level: Not on  file  Occupational History  . Not on file  Social Needs  . Financial resource strain: Not on file  . Food insecurity:    Worry: Not on file    Inability: Not on file  . Transportation needs:    Medical: Not on file    Non-medical: Not on file  Tobacco Use  . Smoking status: Current Every Day Smoker    Packs/day: 3.00    Years: 9.00    Pack years: 27.00    Types: Cigarettes  . Smokeless tobacco: Former Systems developer    Types: Snuff  Substance and Sexual Activity  . Alcohol use: No  . Drug use: No  . Sexual activity: Yes    Birth control/protection: None  Lifestyle  . Physical activity:    Days per week: Not on file     Minutes per session: Not on file  . Stress: Not on file  Relationships  . Social connections:    Talks on phone: Not on file    Gets together: Not on file    Attends religious service: Not on file    Active member of club or organization: Not on file    Attends meetings of clubs or organizations: Not on file    Relationship status: Not on file  Other Topics Concern  . Not on file  Social History Narrative  . Not on file   Additional Social History:   Sleep: Fair  Appetite:  Fair  Current Medications: Current Facility-Administered Medications  Medication Dose Route Frequency Provider Last Rate Last Dose  . acetaminophen (TYLENOL) tablet 650 mg  650 mg Oral Q6H PRN Lindon Romp A, NP      . alum & mag hydroxide-simeth (MAALOX/MYLANTA) 200-200-20 MG/5ML suspension 30 mL  30 mL Oral Q4H PRN Lindon Romp A, NP      . hydrOXYzine (ATARAX/VISTARIL) tablet 25 mg  25 mg Oral Q6H PRN Karel Mowers, Myer Peer, MD   25 mg at 05/06/18 1045  . lurasidone (LATUDA) tablet 40 mg  40 mg Oral Q breakfast Amman Bartel, Myer Peer, MD   40 mg at 05/06/18 1345  . magnesium hydroxide (MILK OF MAGNESIA) suspension 30 mL  30 mL Oral Daily PRN Lindon Romp A, NP      . traZODone (DESYREL) tablet 50 mg  50 mg Oral QHS PRN Rozetta Nunnery, NP        Lab Results:  Results for orders placed or performed during the hospital encounter of 05/04/18 (from the past 48 hour(s))  Hemoglobin A1c     Status: None   Collection Time: 05/05/18  6:27 AM  Result Value Ref Range   Hgb A1c MFr Bld 5.4 4.8 - 5.6 %    Comment: (NOTE) Pre diabetes:          5.7%-6.4% Diabetes:              >6.4% Glycemic control for   <7.0% adults with diabetes    Mean Plasma Glucose 108.28 mg/dL    Comment: Performed at [facility], [place] [address], [place],  [address]  Lipid panel     Status: Abnormal   Collection Time: 05/05/18  6:27 AM  Result Value Ref Range   Cholesterol 143 0 - 200 mg/dL   Triglycerides 90 <150 mg/dL   HDL 35  (L) >40 mg/dL   Total CHOL/HDL Ratio 4.1 RATIO   VLDL 18 0 - 40 mg/dL   LDL Cholesterol 90 0 - 99 mg/dL  Comment:        Total Cholesterol/HDL:CHD Risk Coronary Heart Disease Risk Table                     Men   Women  1/2 Average Risk   3.4   3.3  Average Risk       5.0   4.4  2 X Average Risk   9.6   7.1  3 X Average Risk  23.4   11.0        Use the calculated Patient Ratio above and the CHD Risk Table to determine the patient's CHD Risk.        ATP III CLASSIFICATION (LDL):  <100     mg/dL   Optimal  100-129  mg/dL   Near or Above                    Optimal  130-159  mg/dL   Borderline  160-189  mg/dL   High  >190     mg/dL   Very High Performed at [facility] [address] [place], [place] [address]   TSH     Status: None   Collection Time: 05/05/18  6:27 AM  Result Value Ref Range   TSH 0.824 0.350 - 4.500 uIU/mL    Comment: Performed by a 3rd Generation assay with a functional sensitivity of <=0.01 uIU/mL. Performed at [facility], [place] [address] [place], [place] [address]     Blood Alcohol level:  Lab Results  Component Value Date   ETH <10 79/09/4095    Metabolic Disorder Labs: Lab Results  Component Value Date   HGBA1C 5.4 05/05/2018   MPG 108.28 05/05/2018   No results found for: PROLACTIN Lab Results  Component Value Date   CHOL 143 05/05/2018   TRIG 90 05/05/2018   HDL 35 (L) 05/05/2018   CHOLHDL 4.1 05/05/2018   VLDL 18 05/05/2018   LDLCALC 90 05/05/2018    Physical Findings: AIMS: Facial and Oral Movements Muscles of Facial Expression: None, normal Lips and Perioral Area: None, normal Jaw: None, normal Tongue: None, normal,Extremity Movements Upper (arms, wrists, hands, fingers): None, normal Lower (legs, knees, ankles, toes): None, normal, Trunk Movements Neck, shoulders, hips: None, normal, Overall Severity Severity of abnormal movements (highest score from questions above): None,  normal Incapacitation due to abnormal movements: None, normal Patient's awareness of abnormal movements (rate only patient's report): No Awareness, Dental Status Current problems with teeth and/or dentures?: No Does patient usually wear dentures?: No  CIWA:    COWS:     Musculoskeletal: Strength & Muscle Tone: within normal limits Gait & Station: normal Patient leans: N/A  Psychiatric Specialty Exam: Physical Exam   ROS  no chest pain, no shortness of breath, no vomiting  Blood pressure (!) 93/58, pulse (!) 59, temperature 98.2 F (36.8 C), temperature source Oral, resp. rate 16, height '5\' 7"'$  (1.702 m), weight 113.4 kg, last menstrual period 04/24/2018, SpO2 100 %.Body mass index is 39.16 kg/m.  General Appearance: improving grooming   Eye Contact:  Good  Speech:  Normal Rate  Volume:  Normal  Mood:  Anxious, Depressed and Irritable  Affect:  Constricted and Depressed  Thought Process:  Coherent, Linear and Descriptions of Associations: Intact  Orientation:  Full (Time, Place, and Person)  Thought Content:  denies hallucinations, no delusions, not internally preoccupied   Suicidal Thoughts:  No denies suicidal or self injurious ideations, contracts for safety on  unit at this time  Homicidal Thoughts:  No denies homicidal or violent ideations  Memory:  recent and remote grossly intact   Judgement:  Fair- improving   Insight:  Fair  Psychomotor Activity:  Decreased  Concentration:  Concentration: Good and Attention Span: Good  Recall:  Good  Fund of Knowledge:  Good  Language:  Good  Akathisia:  Negative  Handed:  Right  AIMS (if indicated):     Assets:  Desire for Improvement Resilience  ADL's:  Intact  Cognition:  WNL  Sleep:  Number of Hours: 6.5   Assessment- 27 year old female, reports prior history of Bipolar Disorder and Borderline Personality Disorder diagnosis, presented to hospital for worsening depression, anxiety, suicidal ideations in the context of  significant psychosocial stressors as above . Today reports some improvement compared to how she felt prior to admission and affect presents somewhat more reactive and less irritable. Remains sad, vaguely dysphoric, ruminative, today particularly about her brother not having come to visit her last night.  Denies SI, contracts for safety. Tolerating Latuda well, today states prefers AM dosing rather than QHS- agrees to further medication dose titration, side effects reviewed .   Treatment Plan Summary: Daily contact with patient to assess and evaluate symptoms and progress in treatment, Medication management, Plan inpatient treatment and medications as below Encourage group and milieu participation  Increase Latuda to 40 mgrs QAM for mood disorder  Continue Vistaril 25 mgrs Q 6 hours PRN for anxiety as needed  Continue Trazodone 50 mgrs QHS PRN for insomnia as needed  Treatment team working on disposition planning options  Nanci Pina, [place] 05/06/2018, 3:32 PM   ..Agree with NP Progress Note

## 2018-05-06 NOTE — Progress Notes (Signed)
Adult Psychoeducational Group Note  Date:  05/06/2018 Time:  8:55 PM  Group Topic/Focus:  Wrap-Up Group:   The focus of this group is to help patients review their daily goal of treatment and discuss progress on daily workbooks.  Additional Comments:  Pt did not attend group.   Milus Glazier 05/06/2018, 8:55 PM

## 2018-05-06 NOTE — Progress Notes (Signed)
Patient ID: Tina Wood, female   DOB: 03-27-1991, 27 y.o.   MRN: [id number]  05/06/18 5,20 PM  Patient presented in hallway  Agitated/ angry, loud, crying - had difficulty de escalating , becoming increasingly angry, banging fist, using foul language. Requesting immediate discharge- describes multiple stressors -states she fears her brother may have had "a stroke" , that  she may lose her job, and that her daughter ( in custody of a family member) is coming from [place] and " it is going to be the only time I see her". States that " today is the only day I have transportation to get home". Offered to contact brother- no answer.  Required staff intervention in order to gradually de-escalate  Report from staff is that patient has been more labile today, focused on stressors, crying at times .  Patient denies suicidal ideations and states " all I want is to go see my daughter ". Denies medication side effects. Received Ativan 1 mgr x 1.  Seen at 6,00 PM- much calmer, smiling on approach, stated she was worried about transportation home, hoped to be discharged soon. Denies suicidal ideations , states she is looking forward to being able to see her daughter .  PRN Agitation protocol has been added to medication management .   F Nadya Hopwood,MD

## 2018-05-06 NOTE — B[facility] Group Notes (Signed)
[place] LCSW Group Therapy Note  Date/Time:  05/06/2018 9:00-10:00 or 10:00-11:00AM  Type of Therapy and Topic:  Group Therapy:  Healthy and Unhealthy Supports  Participation Level:  Did Not Attend   Description of Group:  Patients in this group were introduced to the idea of adding a variety of healthy supports to address the various needs in their lives.Patients discussed what additional healthy supports could be helpful in their recovery and wellness after discharge in order to prevent future hospitalizations.   An emphasis was placed on using counselor, doctor, therapy groups, 12-step groups, and problem-specific support groups to expand supports.  They also worked as a group on developing a specific plan for several patients to deal with unhealthy supports through [place], psychoeducation with loved ones, and even termination of relationships.   Therapeutic Goals:   1)  discuss importance of adding supports to stay well once out of the hospital  2)  compare healthy versus unhealthy supports and identify some examples of each  3)  generate ideas and descriptions of healthy supports that can be added  4)  offer mutual support about how to address unhealthy supports  5)  encourage active participation in and adherence to discharge plan    Summary of Patient Progress:  Did not attend  Therapeutic Modalities:   Motivational Interviewing Brief Solution-Focused Therapy  Rolanda Jay

## 2018-05-06 NOTE — Plan of Care (Signed)
Progress Note  D: Pt found in bed; pt compliant with medication administration. Pt tearful after speaking with her family. Pt says she is hopeless and doesn't feel like she has anywhere to go after she leaves here. Pt has financial worries as well since she just started a new job the day before she came here. Pt states she slept well. Pt denies any physical pain at this time rating this a 0/10. Pt rates her hopelessness/hopelessness/anxiety a 0/0/10 out of 10 respectively. Pt states her goal for today is to stay positive when she talks to her brother since she has been depressed he hasn't come to visit her. Pt denies any si/hi/ah/vh and verbally agrees to approach staff if these become apparent.  A: pt provided support and encouragement. Pt given medications per protocol and standing orders. Q33m safety checks implemented and continued.  R: pt safe on the unit. Will continue to monitor.   Pt progressing in the following metrics  Problem: Education: Goal: Verbalization of understanding the information provided will improve Outcome: Progressing   Problem: Activity: Goal: Interest or engagement in activities will improve Outcome: Progressing Goal: Sleeping patterns will improve Outcome: Progressing   Problem: Coping: Goal: Ability to verbalize frustrations and anger appropriately will improve Outcome: Progressing Goal: Ability to demonstrate self-control will improve Outcome: Progressing   Problem: Health Behavior/Discharge Planning: Goal: Identification of resources available to assist in meeting health care needs will improve Outcome: Progressing Goal: Compliance with treatment plan for underlying cause of condition will improve Outcome: Progressing

## 2018-05-07 LAB — PROLACTIN: Prolactin: 20.9 ng/mL (ref 4.8–23.3)

## 2018-05-07 MED ORDER — LORAZEPAM 2 MG/ML IJ SOLN: 1.0000 mg | Freq: Four times a day (QID) | INTRAMUSCULAR | Status: DC | PRN

## 2018-05-07 MED ORDER — LORAZEPAM 0.5 MG PO TABS
0.5000 mg | ORAL_TABLET | Freq: Four times a day (QID) | ORAL | Status: DC | PRN
  Administered 2018-05-07 – 2018-05-08 (×2): 0.5 mg via ORAL
  Filled 2018-05-07 (×2): qty 1

## 2018-05-07 MED ORDER — LORAZEPAM 1 MG PO TABS
1.0000 mg | ORAL_TABLET | Freq: Four times a day (QID) | ORAL | Status: DC | PRN
  Administered 2018-05-07: 1 mg via ORAL
  Filled 2018-05-07: qty 1

## 2018-05-07 NOTE — Progress Notes (Signed)
Patient ID: Tina Wood, female   DOB: 1991-05-11, 27 y.o.   MRN: [id number]  Patient approached nurse reporting increased anxiety. Patient reported she had just received bad news from her roommate that he was leaving the state and their relationship could not be repaired. Patient was tearful but able to discuss her needs to Probation officer. Patient requested to take her Latuda and was provided PRN medication for anxiety. Patient was provided positive reinforcement for coming to staff instead of acting out. Patient agreed to maintain her safety on the unit. Will continue to support and monitor.

## 2018-05-07 NOTE — Progress Notes (Signed)
Per pt request, CSW attempted to contact pt employer, April, Freight forwarder at [employer] in [place].  CSW called and the voicemail identified April, so a message was left that pt remains in the hospital at this time. Winferd Humphrey, MSW, LCSW Clinical Social Worker 05/07/2018 2:12 PM

## 2018-05-07 NOTE — Tx Team (Signed)
Interdisciplinary Treatment and Diagnostic Plan Update  05/07/2018 Time of Session: 1104 Tina Wood MRN: [id number]  Principal Diagnosis: <principal problem not specified>  Secondary Diagnoses: Active Problems:   Severe recurrent major depression without psychotic features ([facility])   Current Medications:  Current Facility-Administered Medications  Medication Dose Route Frequency Provider Last Rate Last Dose  . acetaminophen (TYLENOL) tablet 650 mg  650 mg Oral Q6H PRN Rozetta Nunnery, NP      . alum & mag hydroxide-simeth (MAALOX/MYLANTA) 200-200-20 MG/5ML suspension 30 mL  30 mL Oral Q4H PRN Lindon Romp A, NP      . diphenhydrAMINE (BENADRYL) capsule 50 mg  50 mg Oral Q6H PRN Money, Lowry Ram, FNP   50 mg at 05/06/18 1752   Or  . diphenhydrAMINE (BENADRYL) injection 50 mg  50 mg Intramuscular Q6H PRN Money, Darnelle Maffucci B, FNP      . haloperidol (HALDOL) tablet 5 mg  5 mg Oral Q6H PRN Money, Lowry Ram, FNP   5 mg at 05/06/18 1752   Or  . haloperidol lactate (HALDOL) injection 5 mg  5 mg Intramuscular Q6H PRN Money, Lowry Ram, FNP      . hydrOXYzine (ATARAX/VISTARIL) tablet 25 mg  25 mg Oral Q6H PRN Cob[facility], Myer Peer, MD   25 mg at 05/06/18 1045  . LORazepam (ATIVAN) tablet 2 mg  2 mg Oral Q6H PRN Money, Lowry Ram, FNP   2 mg at 05/06/18 1752   Or  . LORazepam (ATIVAN) injection 2 mg  2 mg Intramuscular Q6H PRN Money, Darnelle Maffucci B, FNP      . lurasidone (LATUDA) tablet 40 mg  40 mg Oral Q breakfast [place], Myer Peer, MD   40 mg at 05/06/18 1345  . magnesium hydroxide (MILK OF MAGNESIA) suspension 30 mL  30 mL Oral Daily PRN Lindon Romp A, NP      . traZODone (DESYREL) tablet 50 mg  50 mg Oral QHS PRN Rozetta Nunnery, NP       PTA Medications: Medications Prior to Admission  Medication Sig Dispense Refill Last Dose  . lurasidone (LATUDA) 20 MG TABS tablet Take 20 mg by mouth at bedtime.   Past Month at Unknown time  . Prenatal MV-Min-FA-Omega-3 (PRENATAL GUMMIES/DHA & FA) 0.4-32.5 MG CHEW Chew  2 tablets by mouth daily.   Past Month at Unknown time    Patient Stressors: Financial difficulties Occupational concerns  Patient Strengths: Average or above average intelligence Communication skills Supportive family/friends Work skills  Treatment Modalities: Medication Management, Group therapy, Case management,  1 to 1 session with clinician, Psychoeducation, Recreational therapy.   Physician Treatment Plan for Primary Diagnosis: <principal problem not specified> Long Term Goal(s): Improvement in symptoms so as ready for discharge Improvement in symptoms so as ready for discharge   Short Term Goals: Ability to identify changes in lifestyle to reduce recurrence of condition will improve Ability to maintain clinical measurements within normal limits will improve Ability to identify changes in lifestyle to reduce recurrence of condition will improve Ability to maintain clinical measurements within normal limits will improve  Medication Management: Evaluate patient's response, side effects, and tolerance of medication regimen.  Therapeutic Interventions: 1 to 1 sessions, Unit Group sessions and Medication administration.  Evaluation of Outcomes: Progressing  Physician Treatment Plan for Secondary Diagnosis: Active Problems:   Severe recurrent major depression without psychotic features ([place])  Long Term Goal(s): Improvement in symptoms so as ready for discharge Improvement in symptoms so as ready for discharge   Short  Term Goals: Ability to identify changes in lifestyle to reduce recurrence of condition will improve Ability to maintain clinical measurements within normal limits will improve Ability to identify changes in lifestyle to reduce recurrence of condition will improve Ability to maintain clinical measurements within normal limits will improve     Medication Management: Evaluate patient's response, side effects, and tolerance of medication regimen.  Therapeutic  Interventions: 1 to 1 sessions, Unit Group sessions and Medication administration.  Evaluation of Outcomes: Progressing   RN Treatment Plan for Primary Diagnosis: <principal problem not specified> Long Term Goal(s): Knowledge of disease and therapeutic regimen to maintain health will improve  Short Term Goals: Ability to identify and develop effective coping behaviors will improve and Compliance with prescribed medications will improve  Medication Management: RN will administer medications as ordered by provider, will assess and evaluate patient's response and provide education to patient for prescribed medication. RN will report any adverse and/or side effects to prescribing provider.  Therapeutic Interventions: 1 on 1 counseling sessions, Psychoeducation, Medication administration, Evaluate responses to treatment, Monitor vital signs and CBGs as ordered, Perform/monitor CIWA, COWS, AIMS and Fall Risk screenings as ordered, Perform wound care treatments as ordered.  Evaluation of Outcomes: Progressing   LCSW Treatment Plan for Primary Diagnosis: <principal problem not specified> Long Term Goal(s): Safe transition to appropriate next level of care at discharge, Engage patient in therapeutic group addressing interpersonal concerns.  Short Term Goals: Engage patient in aftercare planning with referrals and resources, Increase social support and Increase skills for wellness and recovery  Therapeutic Interventions: Assess for all discharge needs, 1 to 1 time with Social worker, Explore available resources and support systems, Assess for adequacy in community support network, Educate family and significant other(s) on suicide prevention, Complete Psychosocial Assessment, Interpersonal group therapy.  Evaluation of Outcomes: Progressing   Progress in Treatment: Attending groups: Yes. Participating in groups: Yes. Taking medication as prescribed: Yes. Toleration medication:  Yes. Family/Significant other contact made: Yes, individual(s) contacted:  friend Patient understands diagnosis: Yes. Discussing patient identified problems/goals with staff: Yes. Medical problems stabilized or resolved: Yes. Denies suicidal/homicidal ideation: Yes. Issues/concerns per patient self-inventory: No. Other: none  New problem(s) identified: No, Describe:  none  New Short Term/Long Term Goal(s):  Patient Goals:  "get on right meds to stabilize depression and anxiety"  Discharge Plan or Barriers:   Reason for Continuation of Hospitalization: Depression Medication stabilization  Estimated Length of Stay: 1-3 days  Attendees: Patient:Tina Wood 05/07/2018   Physician: Dr. Parke Poisson, MD 05/07/2018   Nursing: Mayra Neer, RN 05/07/2018   RN Care Manager: 05/07/2018   Social Worker: Lurline Idol, LCSW 05/07/2018   Recreational Therapist:  05/07/2018   Other:  05/07/2018   Other:  05/07/2018   Other: 05/07/2018        Scribe for Treatment Team: Joanne Chars, [place] 05/07/2018 1:31 PM

## 2018-05-07 NOTE — Progress Notes (Signed)
Writer entered patients room and observed her lyingin bed asleep.Writer called her name and she woke up.Writer asked if she was in any pain and she reported no pain. Writer asked if she was hungry or wanted something to drink but she declined. She had received medication earlier on day shift after after becoming agitated. She has been in bed asleep since shift change. Respirations even and unlabored, no distress noted. Safety maintained on unit with 15 min checks.

## 2018-05-07 NOTE — Progress Notes (Signed)
Patient ID: Tina Wood, female   DOB: 1991-08-23, 27 y.o.   MRN: [id number]  Per previous RN, patient requested her scheduled Latuda at lunchtime. Writer approached patient about her medication at 12:15, pt declined medication stating, "I have to take it with food. I will take it after I eat". Patient was informed by writer to come back to the medication window after lunch. Patient did not return. Patient was asked again at 1700 if she would like her [place]. Patient refused stating, "I can't take it now, it's too late. It won't work anymore". Patient was educated about medication compliance and encouraged to take the medication. Patient grew agitated towards Probation officer and continued to refuse medication. Patient was agreeable to taking PRN Ativan. Will continue to support and monitor.

## 2018-05-07 NOTE — Progress Notes (Signed)
Recreation Therapy Notes  Date: 9.9.19 Time: 0930 Location: [address] Dayroom  Group Topic: Stress Management  Goal Area(s) Addresses:  Patient will verbalize importance of using healthy stress management.  Patient will identify positive emotions associated with healthy stress management.   Intervention: Stress Management  Activity :  [employer].  LRT introduced the stress management technique of meditation.  Patients were to listen as meditation played to engage in the activity.  Education: Stress Management, Discharge Planning.   Education Outcome: Acknowledges edcuation/In group clarification offered/Needs additional education  Clinical Observations/Feedback: Pt did not attend group.    Victorino Sparrow, LRT/CTRS        Victorino Sparrow A 05/07/2018 12:13 PM

## 2018-05-07 NOTE — Progress Notes (Signed)
D: Patient continues to ruminate about her brother and his health.  She thinks her brother has had a stroke, however, there is nothing to substantiate this.  She is focused on discharge and appears upset because she has been told she is not discharging today.  She states, "I've missed my visit with my daughter.  I've also got to start work tomorrow."  Patient has poor insight regarding her admission. She denies any thoughts of self harm. Patient has been told that she needs one good day before she is discharged.  She is aware that if she acts out today, her discharge may be extended.  A: Continue to monitor medication management and MD orders.  Safety checks completed every 15 minutes per protocol.  Offer support and encouragement as needed.  R: Patient is cooperative, however, she is upset because she is not going home today.

## 2018-05-07 NOTE — Progress Notes (Signed)
[facility] MD Progress Note  05/07/2018 1:27 PM Yalda Herd  MRN:  [id number] Subjective:  Patient reports persistent anxiety/depression, although she does endorse some improvement compared to admission. Tends to worry and ruminate about different stressors ( including being able to see her child, whom she states normally lives in [place] with patient's father, but who is currently in [place] for a few days , about her brother, whom she states she has been trying to call but does not answer his phone , and about transportation to return home when she is discharged). Currently denies suicidal ideations. Denies medication side effects.  Objective : I have reviewed chart notes and have met with patient . 27 year old female.  Presented to ED with suicidal ideations of overdosing.  She reports recently increased depression and severe anxiety, with frequent panic attacks.  She reports significant psychosocial stressors- starting new job, signing document recently ceding parental rights of her child, having relationship difficulties with her SO. Presentation has been variable, with episodes of increased anxiety related to above stressors, during which she has been tearful, loud, angry/yelling. She has been able to de-escalate with staff support and intervention. Today she presents better, although still anxious and ruminative. Denies suicidal ideations. Denies medication side effects- on Latuda, Vistaril, which she states have been effective. EKG done - Sinus bradycardia, QTc ( 405)- * Haldol/Ativan have been ordered as agitation protocol if needed  States she slept better last night.  Principal Problem:  Bipolar Disorder, PTSD, also reports prior diagnosis of  Borderline Personality Disorder  Diagnosis:   Patient Active Problem List   Diagnosis Date Noted  . Severe recurrent major depression without psychotic features ([place]) [F33.2] 05/04/2018  . Missed periods [N92.6] 03/12/2018  . Pregnancy examination or  test, negative result [Z32.02] 03/12/2018  . History of seizures [Z87.898] 03/12/2018  . Encounter for IUD removal [Z30.432] 03/16/2017   Total Time spent with patient: 20 minutes  Past Psychiatric History:   Past Medical History:  Past Medical History:  Diagnosis Date  . Asthma   . Brain tumor (benign) ([place])   . Seizures ([place])     Past Surgical History:  Procedure Laterality Date  . SKIN GRAFT    . WRIST SURGERY     Family History: History reviewed. No pertinent family history. Family Psychiatric  History: Social History:  Social History   Substance and Sexual Activity  Alcohol Use No     Social History   Substance and Sexual Activity  Drug Use No    Social History   Socioeconomic History  . Marital status: Single    Spouse name: Not on file  . Number of children: Not on file  . Years of education: Not on file  . Highest education level: Not on file  Occupational History  . Not on file  Social Needs  . Financial resource strain: Not on file  . Food insecurity:    Worry: Not on file    Inability: Not on file  . Transportation needs:    Medical: Not on file    Non-medical: Not on file  Tobacco Use  . Smoking status: Current Every Day Smoker    Packs/day: 3.00    Years: 9.00    Pack years: 27.00    Types: Cigarettes  . Smokeless tobacco: Former Systems developer    Types: Snuff  Substance and Sexual Activity  . Alcohol use: No  . Drug use: No  . Sexual activity: Yes    Birth control/protection: None  Lifestyle  . Physical activity:    Days per week: Not on file    Minutes per session: Not on file  . Stress: Not on file  Relationships  . Social connections:    Talks on phone: Not on file    Gets together: Not on file    Attends religious service: Not on file    Active member of club or organization: Not on file    Attends meetings of clubs or organizations: Not on file    Relationship status: Not on file  Other Topics Concern  . Not on file  Social  History Narrative  . Not on file   Additional Social History:   Sleep: improved   Appetite:  Good  Current Medications: Current Facility-Administered Medications  Medication Dose Route Frequency Provider Last Rate Last Dose  . acetaminophen (TYLENOL) tablet 650 mg  650 mg Oral Q6H PRN Rozetta Nunnery, NP      . alum & mag hydroxide-simeth (MAALOX/MYLANTA) 200-200-20 MG/5ML suspension 30 mL  30 mL Oral Q4H PRN Lindon Romp A, NP      . diphenhydrAMINE (BENADRYL) capsule 50 mg  50 mg Oral Q6H PRN Money, Lowry Ram, FNP   50 mg at 05/06/18 1752   Or  . diphenhydrAMINE (BENADRYL) injection 50 mg  50 mg Intramuscular Q6H PRN Money, Darnelle Maffucci B, FNP      . haloperidol (HALDOL) tablet 5 mg  5 mg Oral Q6H PRN Money, Lowry Ram, FNP   5 mg at 05/06/18 1752   Or  . haloperidol lactate (HALDOL) injection 5 mg  5 mg Intramuscular Q6H PRN Money, Lowry Ram, FNP      . hydrOXYzine (ATARAX/VISTARIL) tablet 25 mg  25 mg Oral Q6H PRN Cobos, Myer Peer, MD   25 mg at 05/06/18 1045  . LORazepam (ATIVAN) tablet 2 mg  2 mg Oral Q6H PRN Money, Lowry Ram, FNP   2 mg at 05/06/18 1752   Or  . LORazepam (ATIVAN) injection 2 mg  2 mg Intramuscular Q6H PRN Money, Darnelle Maffucci B, FNP      . lurasidone (LATUDA) tablet 40 mg  40 mg Oral Q breakfast Cobos, Myer Peer, MD   40 mg at 05/06/18 1345  . magnesium hydroxide (MILK OF MAGNESIA) suspension 30 mL  30 mL Oral Daily PRN Lindon Romp A, NP      . traZODone (DESYREL) tablet 50 mg  50 mg Oral QHS PRN Rozetta Nunnery, NP        Lab Results:  No results found for this or any previous visit (from the past 48 hour(s)).  Blood Alcohol level:  Lab Results  Component Value Date   ETH <10 53/66/4403    Metabolic Disorder Labs: Lab Results  Component Value Date   HGBA1C 5.4 05/05/2018   MPG 108.28 05/05/2018   No results found for: PROLACTIN Lab Results  Component Value Date   CHOL 143 05/05/2018   TRIG 90 05/05/2018   HDL 35 (L) 05/05/2018   CHOLHDL 4.1 05/05/2018   VLDL  18 05/05/2018   LDLCALC 90 05/05/2018    Physical Findings: AIMS: Facial and Oral Movements Muscles of Facial Expression: None, normal Lips and Perioral Area: None, normal Jaw: None, normal Tongue: None, normal,Extremity Movements Upper (arms, wrists, hands, fingers): None, normal Lower (legs, knees, ankles, toes): None, normal, Trunk Movements Neck, shoulders, hips: None, normal, Overall Severity Severity of abnormal movements (highest score from questions above): None, normal Incapacitation due to abnormal movements: None, normal  Patient's awareness of abnormal movements (rate only patient's report): No Awareness, Dental Status Current problems with teeth and/or dentures?: No Does patient usually wear dentures?: No  CIWA:    COWS:     Musculoskeletal: Strength & Muscle Tone: within normal limits Gait & Station: normal Patient leans: N/A  Psychiatric Specialty Exam: Physical Exam  ROS no chest pain, no shortness of breath, no vomiting, no diarrhea, no fever, no chills   Blood pressure 119/88, pulse 70, temperature 98.2 F (36.8 C), temperature source Oral, resp. rate 16, height '5\' 7"'$  (1.702 m), weight 113.4 kg, last menstrual period 04/24/2018, SpO2 100 %.Body mass index is 39.16 kg/m.  General Appearance: improved grooming   Eye Contact:  Good  Speech:  Normal Rate  Volume:  Normal  Mood:  partially improved today, but remains labile   Affect:  labile, intermittently anxious   Thought Process:  Linear and Descriptions of Associations: Intact  Orientation:  Full (Time, Place, and Person)  Thought Content:  denies hallucinations, no delusions, not internally preoccupied   Suicidal Thoughts:  No denies suicidal or self injurious ideations, contracts for safety on unit at this time  Homicidal Thoughts:  No denies homicidal or violent ideations  Memory:  recent and remote grossly intact   Judgement:  Fair- improving   Insight:  Fair  Psychomotor Activity:  Normal   Concentration:  Concentration: Good and Attention Span: Good  Recall:  Good  Fund of Knowledge:  Good  Language:  Good  Akathisia:  Negative  Handed:  Right  AIMS (if indicated):     Assets:  Desire for Improvement Resilience  ADL's:  Intact  Cognition:  WNL  Sleep:  Number of Hours: 6.75   Assessment- 27 year old female, reports prior history of Bipolar Disorder and Borderline Personality Disorder diagnosis, presented to hospital for worsening depression, anxiety, suicidal ideations in the context of significant psychosocial stressors as above . Today presents calmer and more easily redirected, but remains labile and tends to become agitated during episodes of anxious ruminations about issues such as seeing her child, not being able to connect with her brother over the phone, concerns about transportation back home at discharge. Yesterday had episode of severe agitation, today more responsive to redirection and reassurance Tolerating medications well . Denies Suicidal ideations.   Treatment Plan Summary: Daily contact with patient to assess and evaluate symptoms and progress in treatment, Medication management, Plan inpatient treatment and medications as below  Treatment Plan reviewed as below today 9/9  Encourage group and milieu participation to work on coping skills  Continue  Latuda  40 mgrs QAM for mood disorder  D/C Vistaril PRNs  Start Ativan 0.5 mgrs Q 6 hours PRN for anxiety  Will also continue Haldol/Ativan PRN for agitation as needed - per agitation protocol.  Continue Trazodone 50 mgrs QHS PRN for insomnia as needed  Treatment team working on disposition planning options  Jenne Campus, MD 05/07/2018, 1:27 PM    Patient ID: [name], female   DOB: 1990/12/06, 27 y.o.   MRN: [id number]

## 2018-05-08 DIAGNOSIS — R45851 Suicidal ideations: Secondary | ICD-10-CM

## 2018-05-08 MED ORDER — LURASIDONE HCL 40 MG PO TABS: 40.0000 mg | ORAL_TABLET | Freq: Every day | ORAL | 0 refills | Status: DC

## 2018-05-08 MED ORDER — TRAZODONE HCL 50 MG PO TABS: 50.0000 mg | ORAL_TABLET | Freq: Every evening | ORAL | 0 refills | Status: DC | PRN

## 2018-05-08 NOTE — BHH Suicide Risk Assessment (Signed)
[facility] Discharge Suicide Risk Assessment   Principal Problem: <principal problem not specified> Discharge Diagnoses:  Patient Active Problem List   Diagnosis Date Noted  . Severe recurrent major depression without psychotic features ([place]) [F33.2] 05/04/2018  . Missed periods [N92.6] 03/12/2018  . Pregnancy examination or test, negative result [Z32.02] 03/12/2018  . History of seizures [Z87.898] 03/12/2018  . Encounter for IUD removal [Z30.432] 03/16/2017    Total Time spent with patient: 15 minutes  Musculoskeletal: Strength & Muscle Tone: within normal limits Gait & Station: normal Patient leans: N/A  Psychiatric Specialty Exam: Review of Systems  All other systems reviewed and are negative.   Blood pressure 119/88, pulse 70, temperature 98.2 F (36.8 C), temperature source Oral, resp. rate 16, height 5\' 7"  (1.702 m), weight 113.4 kg, last menstrual period 04/24/2018, SpO2 100 %.Body mass index is 39.16 kg/m.  General Appearance: Casual  Eye Contact::  Good  Speech:  Normal Rate409  Volume:  Normal  Mood:  Euthymic  Affect:  Congruent  Thought Process:  Coherent and Descriptions of Associations: Intact  Orientation:  Full (Time, Place, and Person)  Thought Content:  Logical  Suicidal Thoughts:  No  Homicidal Thoughts:  No  Memory:  Immediate;   Fair Recent;   Fair Remote;   Fair  Judgement:  Intact  Insight:  Fair  Psychomotor Activity:  Normal  Concentration:  Fair  Recall:  [employer] of Knowledge:Fair  Language: Good  Akathisia:  Negative  Handed:  Right  AIMS (if indicated):     Assets:  Communication Skills Desire for Improvement Financial Resources/Insurance Housing Physical Health Resilience Social Support  Sleep:  Number of Hours: 6.75  Cognition: WNL  ADL's:  Intact   Mental Status Per Nursing Assessment::   On Admission:  Self-harm thoughts, Intention to act on suicide plan  Demographic Factors:  Caucasian  Loss Factors: NA  Historical  Factors: Impulsivity  Risk Reduction Factors:   Sense of responsibility to family, Living with another person, especially a relative and Positive social support  Continued Clinical Symptoms:  Depression:   Impulsivity  Cognitive Features That Contribute To Risk:  None    Suicide Risk:  Minimal: No identifiable suicidal ideation.  Patients presenting with no risk factors but with morbid ruminations; may be classified as minimal risk based on the severity of the depressive symptoms    Plan Of Care/Follow-up recommendations:  Activity:  ad lib  Sharma Covert, MD 05/08/2018, 9:57 AM

## 2018-05-08 NOTE — Progress Notes (Signed)
  [facility] Adult Case Management Discharge Plan :  Will you be returning to the same living situation after discharge:  Yes,  with brother At discharge, do you have transportation home?: Yes,  parents or friend Do you have the ability to pay for your medications: Yes,  medicaid  Release of information consent forms completed and in the chart;  Patient's signature needed at discharge.  Patient to Follow up at: Follow-up Information    Services, [facility]. Go on 05/11/2018.   Why:  Please attend your appt on Friday, 05/11/18, at 10:00am.  Please bring photo ID, social security card, and medicaid card. Contact information: [address] [place] [address] [phone]           Next level of care provider has access to [place] and Suicide Prevention discussed: Yes,  with friend  Have you used any form of tobacco in the last 30 days? (Cigarettes, Smokeless Tobacco, Cigars, and/or Pipes): No  Has patient been referred to the Quitline?: Patient refused referral  Patient has been referred for addiction treatment: Yes  Joanne Chars, [place] 05/08/2018, 10:46 AM

## 2018-05-08 NOTE — Progress Notes (Signed)
Adult Psychoeducational Group Note  Date:  05/08/2018 Time:  2:35 AM  Group Topic/Focus:  Wrap-Up Group:   The focus of this group is to help patients review their daily goal of treatment and discuss progress on daily workbooks.  Participation Level:  Active  Participation Quality:  Sharing  Affect:  Excited  Cognitive:  Appropriate  Insight: Good  Engagement in Group:  Engaged  Modes of Intervention:  Discussion  Additional Comments:  Patient goal was to stay positive and [place] with MD to discuss D/C. Patient did accomplish goal and will be D/C tomorrow.  Amillia Biffle, [place] 05/08/2018, 2:35 AM

## 2018-05-08 NOTE — Progress Notes (Signed)
D:  Jisela was up and visible on the unit.  She did attend evening wrap up group.  She reported feeling anxious earlier but the lorazepam was helpful.  She is aware that she would not be able to take another dose soon.  She stated her day was "Positive even though my brother had thrown a lot of negativity my way, it still ended up positive."  She was very focused on being discharged tomorrow and asked multiple times if that would happen.  Encouraged her to discuss this with her MD tomorrow.  She voiced questions about how to know if she is getting 350 calories with her Latuda.  Reviewed reading food labels to see how many calories are in various foods.  She verbalized understanding.  She declined trazodone at hs because "it doesn't work."   A:  1:1 with Therapist, sports for support and encouragement.  Medications as ordered.  Q 15 minute checks maintained for safety.  Encouraged participation in group and unit activities.   R:  [place] remains safe on the unit.  We will continue to monitor the progress towards her goals.

## 2018-05-08 NOTE — Plan of Care (Signed)
  Problem: Activity: Goal: Will identify at least one activity in which they can participate Outcome: Progressing-pt reported that she likes to color and listen to music when she is feeling anxious.

## 2018-05-08 NOTE — Discharge Summary (Signed)
Physician Discharge Summary Note  Patient:  Tina Wood is an 27 y.o., female  MRN:  [id number]  DOB:  [birth year]  Patient phone:  [phone] (home)   Patient address:   [place] [address],   Total Time spent with patient: Greater than 30 minutes  Date of Admission:  05/04/2018  Date of Discharge: 05-08-18  Reason for Admission: Worsening anxiety/panic attacks.  Principal Problem: Severe recurrent major depression without psychotic features [facility])  Discharge Diagnoses: Patient Active Problem List   Diagnosis Date Noted  . Severe recurrent major depression without psychotic features ([place]) [F33.2] 05/04/2018    Priority: High  . Missed periods [N92.6] 03/12/2018  . Pregnancy examination or test, negative result [Z32.02] 03/12/2018  . History of seizures [Z87.898] 03/12/2018  . Encounter for IUD removal [Z30.432] 03/16/2017   Past Psychiatric History: Major depression.  Past Medical History:  Past Medical History:  Diagnosis Date  . Asthma   . Brain tumor (benign) ([place])   . Seizures ([place])     Past Surgical History:  Procedure Laterality Date  . SKIN GRAFT    . WRIST SURGERY     Family History: History reviewed. No pertinent family history.  Family Psychiatric  History: See Md's SRA  Social History:  Social History   Substance and Sexual Activity  Alcohol Use No     Social History   Substance and Sexual Activity  Drug Use No    Social History   Socioeconomic History  . Marital status: Single    Spouse name: Not on file  . Number of children: Not on file  . Years of education: Not on file  . Highest education level: Not on file  Occupational History  . Not on file  Social Needs  . Financial resource strain: Not on file  . Food insecurity:    Worry: Not on file    Inability: Not on file  . Transportation needs:    Medical: Not on file    Non-medical: Not on file  Tobacco Use  . Smoking status: Current Every Day Smoker     Packs/day: 3.00    Years: 9.00    Pack years: 27.00    Types: Cigarettes  . Smokeless tobacco: Former Systems developer    Types: Snuff  Substance and Sexual Activity  . Alcohol use: No  . Drug use: No  . Sexual activity: Yes    Birth control/protection: None  Lifestyle  . Physical activity:    Days per week: Not on file    Minutes per session: Not on file  . Stress: Not on file  Relationships  . Social connections:    Talks on phone: Not on file    Gets together: Not on file    Attends religious service: Not on file    Active member of club or organization: Not on file    Attends meetings of clubs or organizations: Not on file    Relationship status: Not on file  Other Topics Concern  . Not on file  Social History Narrative  . Not on file   Hospital Course: (Per Md's admission notes): 27 year old single female, homeless, presented to ED via [employer], whom she states she had asked her brother to contact . Reports she has been facing significant stressors , and states" it was my first day on the job, my boyfriend wanted to break up with me, I lost my parental rights to my child". States that on day of admission she felt very  anxious , had two severe panic attacks . States she developed suicidal ideations, but without plan or intention, so told brother to call 911. She endorses recent depression related to stressors,as above, and endorses recent neuro-vegetative symptoms of depression as below. Denies psychotic symptoms. States she does not feel inpatient psychiatric admission was necessary " I did not actually hurt myself , I really just wanted to go to [facility] , I would have gotten back on my medication and been fine ".   After the above admission assessment, [name] was started on the medication regimen for her presenting symptoms. She received & was discharged on; Latuda 40 mg for mood control &Trazodone 50 mg prn for insomnia. She was enrolled & participated in the group counseling sessions being  offered & held on this unit. She learned coping skills. There were no other medical issues presented. She tolerated her treatment regimen without any adverse effects or reactions reported.   [name] is seen today by the attending psychiatrist for discharge. She says she has normal anxiety about going home. She is not overwhelmed by this. She is looking forward to working on her mental health issues. Not expressing any delusions today. No hallucinations. Feels in control of herself. No fantasy about suicide. No suicidal thoughts. Looking forward to getting back to her life again. No thoughts of violence. Does not feel depressed. No evidence of mania.  The nursing staff reports that patient has been appropriate on the unit. Patient has been interacting well with peers. No behavioral issues. Patient has not voiced any suicidal thoughts. Patient has not been observed to be internally stimulated or preoccupied. Patient has been adherent with treatment recommendations. Patient has been tolerating her medications well. No reported adverse effects or reactions.   Patient was discussed at the treatment team meeting this morning. The team members feel that patient is back to her baseline level of function. Team agrees with plan to discharge patient today to continue mental health health care on an outpatient bais. She left [facility] [place] with all personal belongings in no apparent distress.  Physical Findings: AIMS: Facial and Oral Movements Muscles of Facial Expression: None, normal Lips and Perioral Area: None, normal Jaw: None, normal Tongue: None, normal,Extremity Movements Upper (arms, wrists, hands, fingers): None, normal Lower (legs, knees, ankles, toes): None, normal, Trunk Movements Neck, shoulders, hips: None, normal, Overall Severity Severity of abnormal movements (highest score from questions above): None, normal Incapacitation due to abnormal movements: None, normal Patient's awareness of abnormal  movements (rate only patient's report): No Awareness, Dental Status Current problems with teeth and/or dentures?: No Does patient usually wear dentures?: No  CIWA:    COWS:     Musculoskeletal: Strength & Muscle Tone: within normal limits Gait & Station: normal Patient leans: N/A  Psychiatric Specialty Exam: Physical Exam  Constitutional: She appears well-developed.  HENT:  Head: Normocephalic.  Eyes: Pupils are equal, round, and reactive to light.  Neck: Normal range of motion.  Cardiovascular: Normal rate.  Respiratory: Effort normal.  GI: Soft.  Genitourinary:  Genitourinary Comments: Deferred  Musculoskeletal: Normal range of motion.  Neurological: She is alert.  Skin: Skin is warm.    Review of Systems  Constitutional: Negative.   HENT: Negative.   Eyes: Negative.   Respiratory: Negative.   Cardiovascular: Negative.   Gastrointestinal: Negative.   Genitourinary: Negative.   Musculoskeletal: Negative.   Skin: Negative.   Neurological: Negative.   Endo/Heme/Allergies: Negative.   Psychiatric/Behavioral: Positive for depression (Stable). Negative for hallucinations, memory loss,  substance abuse and suicidal ideas. The patient has insomnia (Stable). The patient is not nervous/anxious.     Blood pressure 119/88, pulse 70, temperature 98.2 F (36.8 C), temperature source Oral, resp. rate 16, height 5\' 7"  (1.702 m), weight 113.4 kg, last menstrual period 04/24/2018, SpO2 100 %.Body mass index is 39.16 kg/m.  See Md's SRA   Have you used any form of tobacco in the last 30 days? (Cigarettes, Smokeless Tobacco, Cigars, and/or Pipes): No  Has this patient used any form of tobacco in the last 30 days? (Cigarettes, Smokeless Tobacco, Cigars, and/or Pipes): N/A  Blood Alcohol level:  Lab Results  Component Value Date   ETH <10 62/37/6283   Metabolic Disorder Labs:  Lab Results  Component Value Date   HGBA1C 5.4 05/05/2018   MPG 108.28 05/05/2018   Lab Results   Component Value Date   PROLACTIN 20.9 05/05/2018   Lab Results  Component Value Date   CHOL 143 05/05/2018   TRIG 90 05/05/2018   HDL 35 (L) 05/05/2018   CHOLHDL 4.1 05/05/2018   VLDL 18 05/05/2018   LDLCALC 90 05/05/2018   See Psychiatric Specialty Exam and Suicide Risk Assessment completed by Attending Physician prior to discharge.  Discharge destination:  Home  Is patient on multiple antipsychotic therapies at discharge:  No   Has Patient had three or more failed trials of antipsychotic monotherapy by history:  No  Recommended Plan for Multiple Antipsychotic Therapies: NA  Allergies as of 05/08/2018      Reactions   Cherry Anaphylaxis   Coconut Flavor Anaphylaxis   Anything with coconut-throat swells   Pomegranate [punica] Anaphylaxis   Bee Venom Swelling   Nicotine    Grass Extracts [gramineae Pollens] Hives   Latex Itching   Shellfish Allergy Diarrhea   Tape Rash      Medication List    STOP taking these medications   PRENATAL GUMMIES/DHA & FA 0.4-32.5 MG Chew     TAKE these medications     Indication  lurasidone 40 MG Tabs tablet Commonly known as:  LATUDA Take 1 tablet (40 mg total) by mouth daily with breakfast. For mood control What changed:    medication strength  how much to take  when to take this  additional instructions  Indication:  Mood control   traZODone 50 MG tablet Commonly known as:  DESYREL Take 1 tablet (50 mg total) by mouth at bedtime as needed for sleep.  Indication:  Trouble Sleeping      Follow-up Information    Services, [facility]. Go on 05/11/2018.   Why:  Please attend your appt on Friday, 05/11/18, at 10:00am.  Please bring photo ID, social security card, and medicaid card. Contact information: [address] [place] [address] [phone]          Follow-up recommendations: Activity:  As tolerated Diet: As recommended by your primary care doctor. Keep all scheduled follow-up appointments as  recommended.   Comments: Patient is instructed prior to discharge to: Take all medications as prescribed by his/her mental healthcare provider. Report any adverse effects and or reactions from the medicines to his/her outpatient provider promptly. Patient has been instructed & cautioned: To not engage in alcohol and or illegal drug use while on prescription medicines. In the event of worsening symptoms, patient is instructed to call the crisis hotline, 911 and or go to the nearest ED for appropriate evaluation and treatment of symptoms. To follow-up with his/her primary care provider for your other medical  issues, concerns and or health care needs.   Signed: Lindell Spar, NP, PMHNP, FNP-BC 05/09/2018, 9:22 AM

## 2018-05-08 NOTE — [facility] Suicide Risk Assessment (Signed)
[facility] INPATIENT:  Family/Significant Other Suicide Prevention Education  Suicide Prevention Education:  Education Completed; Tina Wood, friend, [phone] been identified by the patient as the family member/significant other with whom the patient will be residing, and identified as the person(s) who will aid the patient in the event of a mental health crisis (suicidal ideations/suicide attempt).  With written consent from the patient, the family member/significant other has been provided the following suicide prevention education, prior to the and/or following the discharge of the patient.  The suicide prevention education provided includes the following:  Suicide risk factors  Suicide prevention and interventions  National Suicide Hotline telephone number  [facility] assessment telephone number  [facility] Emergency Assistance IXL and/or Residential Mobile Crisis Unit telephone number  Request made of family/significant other to:  Remove weapons (e.g., guns, rifles, knives), all items previously/currently identified as safety concern.    Remove drugs/medications (over-the-counter, prescriptions, illicit drugs), all items previously/currently identified as a safety concern.  The family member/significant other verbalizes understanding of the suicide prevention education information provided.  The family member/significant other agrees to remove the items of safety concern listed above.  CSW spoke with Tina Wood, who said he had been contacted by another CSW. (no documentation was in epic) and they had discussed SPE.  CSW reviewed this with Tina Wood, who reports he will continue to be supportive of pt after discharge.  Pt called him yesterday and told him she may be going home.  Pt was calling grandparents about transportation, but he may be available if needed to get her home.  Tina Chars, LCSW  05/08/2018, 10:15 AM

## 2018-05-08 NOTE — Progress Notes (Signed)
Pt discharged home on a bus pass. Pt was ambulatory, stable and appreciative at that time. All papers and prescriptions were given and valuables returned. Verbal understanding expressed. Denies SI/HI and A/VH. Pt given opportunity to express concerns and ask questions.

## 2018-07-09 DIAGNOSIS — O0992 Supervision of high risk pregnancy, unspecified, second trimester: Secondary | ICD-10-CM | POA: Insufficient documentation

## 2018-07-09 DIAGNOSIS — Z659 Problem related to unspecified psychosocial circumstances: Secondary | ICD-10-CM | POA: Insufficient documentation

## 2018-07-10 DIAGNOSIS — R4689 Other symptoms and signs involving appearance and behavior: Secondary | ICD-10-CM | POA: Insufficient documentation

## 2018-07-10 LAB — OB RESULTS CONSOLE ABO/RH: RH Type: POSITIVE

## 2018-07-10 LAB — OB RESULTS CONSOLE RPR: RPR: NONREACTIVE

## 2018-07-10 LAB — OB RESULTS CONSOLE RUBELLA ANTIBODY, IGM: Rubella: IMMUNE

## 2018-07-10 LAB — HIV ANTIBODY (ROUTINE TESTING W REFLEX): HIV Screen 4th Generation wRfx: NONREACTIVE

## 2018-07-20 LAB — OB RESULTS CONSOLE HGB/HCT, BLOOD
HCT: 39 (ref 29–41)
Hemoglobin: 13.3

## 2018-07-20 LAB — OB RESULTS CONSOLE PLATELET COUNT: Platelets: 213

## 2018-08-29 NOTE — L&D Delivery Note (Signed)
Delivery Note Pt progressed to complete at 2217 and pushed well. At 10:29 PM a viable female was delivered via Vaginal, Spontaneous (Presentation: LOA).  APGAR: 7, 9; weight 3572gm (7lb 14oz).  Nuchal cord x 1 reduced prior to delivery. Infant dried and placed on pt's abd; cord clamped and cut by pt after 1 min delay; hospital cord blood sample collected. Placenta status: spont , intact.  Cord: 3 vessel  Anesthesia: Epidural Episiotomy: None Lacerations: None Est. Blood Loss (mL): 50  Mom to postpartum.  Baby to Couplet care / Skin to Skin.  Myrtis Ser CNM 01/23/2019, 11:29 PM  Please schedule this patient for Postpartum visit in: 4 weeks with the following provider: Any provider For C/S patients schedule nurse incision check in weeks 2 weeks: no High risk pregnancy complicated by: multiple social concerns (housing, custody, etc) Delivery mode:  SVD Anticipated Birth Control:  other/unsure PP Procedures needed: none  Schedule Integrated BH visit: yes- for 2 wks PP

## 2018-10-05 LAB — OB RESULTS CONSOLE GC/CHLAMYDIA
Chlamydia: NEGATIVE
Gonorrhea: NEGATIVE

## 2018-10-16 ENCOUNTER — Encounter: Payer: Self-pay | Admitting: Obstetrics and Gynecology

## 2018-10-16 ENCOUNTER — Ambulatory Visit (INDEPENDENT_AMBULATORY_CARE_PROVIDER_SITE_OTHER): Payer: Medicaid Other | Admitting: Obstetrics and Gynecology

## 2018-10-16 ENCOUNTER — Other Ambulatory Visit: Payer: Self-pay

## 2018-10-16 ENCOUNTER — Other Ambulatory Visit (HOSPITAL_COMMUNITY)
Admission: RE | Admit: 2018-10-16 | Discharge: 2018-10-16 | Disposition: A | Discharge status: Home / Self Care | Payer: Medicaid Other | Source: Ambulatory Visit | Attending: Obstetrics and Gynecology | Admitting: Obstetrics and Gynecology

## 2018-10-16 DIAGNOSIS — O99342 Other mental disorders complicating pregnancy, second trimester: Secondary | ICD-10-CM

## 2018-10-16 DIAGNOSIS — F3130 Bipolar disorder, current episode depressed, mild or moderate severity, unspecified: Secondary | ICD-10-CM | POA: Diagnosis present

## 2018-10-16 DIAGNOSIS — Z659 Problem related to unspecified psychosocial circumstances: Secondary | ICD-10-CM

## 2018-10-16 DIAGNOSIS — Z3482 Encounter for supervision of other normal pregnancy, second trimester: Secondary | ICD-10-CM

## 2018-10-16 DIAGNOSIS — O0992 Supervision of high risk pregnancy, unspecified, second trimester: Secondary | ICD-10-CM

## 2018-10-16 DIAGNOSIS — D352 Benign neoplasm of pituitary gland: Secondary | ICD-10-CM

## 2018-10-16 DIAGNOSIS — Z23 Encounter for immunization: Secondary | ICD-10-CM

## 2018-10-16 DIAGNOSIS — Z3A25 25 weeks gestation of pregnancy: Secondary | ICD-10-CM

## 2018-10-16 DIAGNOSIS — Z3480 Encounter for supervision of other normal pregnancy, unspecified trimester: Secondary | ICD-10-CM | POA: Insufficient documentation

## 2018-10-16 DIAGNOSIS — F319 Bipolar disorder, unspecified: Secondary | ICD-10-CM

## 2018-10-16 NOTE — Patient Instructions (Signed)

## 2018-10-16 NOTE — Progress Notes (Signed)
NOB.  FLU vaccine given RD, tolerated well.  C/o seeing blood when she wiped last night.

## 2018-10-16 NOTE — Progress Notes (Signed)
Subjective:  Tina Wood is a 28 y.o. [id number] at [redacted]w[redacted]d being seen today for ongoing prenatal care. Transferred from [facility]. Prenatal care was been sporadic d/t to poor social situation. See prior OB visit notes for additional information. Not taking Latuda because pharmacist said it was not save. Pt reassured OK to take Latuda .   She is currently monitored for the following issues for this high-risk pregnancy and has History of seizures; Severe recurrent major depression without psychotic features ([place]); Bipolar 1 disorder ([place]); Poor social situation; and Pituitary microadenoma [facility]) on their problem list.  Patient reports no complaints.  Contractions: Not present. Vag. Bleeding: None.  Movement: Present. Denies leaking of fluid.   The following portions of the patient's history were reviewed and updated as appropriate: allergies, current medications, past family history, past medical history, past social history, past surgical history and problem list. Problem list updated.  Objective:   Vitals:   10/16/18 1535  BP: 108/69  Pulse: 87  Temp: (!) 97.4 F (36.3 C)  Weight: 289 lb (131.1 kg)    Fetal Status: Fetal Heart Rate (bpm): 150   Movement: Present     General:  Alert, oriented and cooperative. Patient is in no acute distress.  Skin: Skin is warm and dry. No rash noted.   Cardiovascular: Normal heart rate noted  Respiratory: Normal respiratory effort, no problems with respiration noted  Abdomen: Soft, gravid, appropriate for gestational age. Pain/Pressure: Absent     Pelvic:  Cervical exam performed        Extremities: Normal range of motion.  Edema: None  Mental Status: Normal mood and affect. Normal behavior. Normal judgment and thought content.   Urinalysis:      Assessment and Plan:  Pregnancy: [id number] at [redacted]w[redacted]d  1. Supervision of high risk pregnancy in second trimester Prenatal care and labs reviewed with pt. Completion of OB labs ordered today. - Cytology - PAP(  [place]) - SMN1 COPY NUMBER ANALYSIS (SMA Carrier Screen) - Genetic Screening - Flu Vaccine QUAD 36+ mos IM (Fluarix, Quad PF) - Hemoglobinopathy evaluation - Cystic Fibrosis Mutation 78 - [place] MFM OB DETAIL +14 WK; Future [place]a next visit  2. Bipolar 1 disorder ([place]) Pt instructed to start [place]. - [place] MFM OB DETAIL +14 WK; Future  3. Poor social situation Lives in [place] now.   4. Pituitary microadenoma ([place]) Will check prolactin - Prolactin  Lives in [place] now. [place] office is closer, will schedule remainder of her care in that office d/t to transportation issues.  Preterm labor symptoms and general obstetric precautions including but not limited to vaginal bleeding, contractions, leaking of fluid and fetal movement were reviewed in detail with the patient. Please refer to After Visit Summary for other counseling recommendations.  Return in about 3 weeks (around 11/06/2018) for OB visit.   Chancy Milroy, MD

## 2018-10-17 LAB — PROLACTIN: Prolactin: 74.7 ng/mL — ABNORMAL HIGH (ref 4.8–23.3)

## 2018-10-18 LAB — CYTOLOGY - PAP
Bacterial vaginitis: NEGATIVE
Diagnosis: NEGATIVE

## 2018-10-26 LAB — SMN1 COPY NUMBER ANALYSIS (SMA CARRIER SCREENING)

## 2018-10-26 LAB — CYSTIC FIBROSIS MUTATION 97: Interpretation: NOT DETECTED

## 2018-10-26 LAB — HEMOGLOBINOPATHY EVALUATION
HGB C: 0 %
HGB S: 0 %
HGB VARIANT: 0 %
Hemoglobin A2 Quantitation: 2.3 % (ref 1.8–3.2)
Hemoglobin F Quantitation: 0 % (ref 0.0–2.0)
Hgb A: 97.7 % (ref 96.4–98.8)

## 2018-10-30 ENCOUNTER — Ambulatory Visit (HOSPITAL_COMMUNITY): Payer: Medicaid Other

## 2018-11-06 ENCOUNTER — Encounter: Payer: Self-pay | Admitting: Advanced Practice Midwife

## 2018-11-06 ENCOUNTER — Ambulatory Visit (INDEPENDENT_AMBULATORY_CARE_PROVIDER_SITE_OTHER): Payer: Medicaid Other | Admitting: Advanced Practice Midwife

## 2018-11-06 ENCOUNTER — Ambulatory Visit (HOSPITAL_COMMUNITY)
Admission: RE | Admit: 2018-11-06 | Discharge: 2018-11-06 | Disposition: A | Discharge status: Home / Self Care | Payer: Medicaid Other | Source: Ambulatory Visit | Attending: Obstetrics and Gynecology | Admitting: Obstetrics and Gynecology

## 2018-11-06 ENCOUNTER — Other Ambulatory Visit (HOSPITAL_COMMUNITY): Payer: Self-pay | Admitting: *Deleted

## 2018-11-06 ENCOUNTER — Encounter: Payer: Self-pay | Admitting: *Deleted

## 2018-11-06 VITALS — Wt 288.0 lb

## 2018-11-06 DIAGNOSIS — O2693 Pregnancy related conditions, unspecified, third trimester: Secondary | ICD-10-CM

## 2018-11-06 DIAGNOSIS — Z8619 Personal history of other infectious and parasitic diseases: Secondary | ICD-10-CM | POA: Insufficient documentation

## 2018-11-06 DIAGNOSIS — O0933 Supervision of pregnancy with insufficient antenatal care, third trimester: Secondary | ICD-10-CM

## 2018-11-06 DIAGNOSIS — Z362 Encounter for other antenatal screening follow-up: Secondary | ICD-10-CM

## 2018-11-06 DIAGNOSIS — J45909 Unspecified asthma, uncomplicated: Secondary | ICD-10-CM

## 2018-11-06 DIAGNOSIS — F319 Bipolar disorder, unspecified: Secondary | ICD-10-CM

## 2018-11-06 DIAGNOSIS — O99213 Obesity complicating pregnancy, third trimester: Secondary | ICD-10-CM

## 2018-11-06 DIAGNOSIS — Z659 Problem related to unspecified psychosocial circumstances: Secondary | ICD-10-CM

## 2018-11-06 DIAGNOSIS — T7421XS Adult sexual abuse, confirmed, sequela: Secondary | ICD-10-CM | POA: Insufficient documentation

## 2018-11-06 DIAGNOSIS — Z363 Encounter for antenatal screening for malformations: Secondary | ICD-10-CM

## 2018-11-06 DIAGNOSIS — O0990 Supervision of high risk pregnancy, unspecified, unspecified trimester: Secondary | ICD-10-CM

## 2018-11-06 DIAGNOSIS — O99353 Diseases of the nervous system complicating pregnancy, third trimester: Secondary | ICD-10-CM | POA: Diagnosis not present

## 2018-11-06 DIAGNOSIS — O9989 Other specified diseases and conditions complicating pregnancy, childbirth and the puerperium: Secondary | ICD-10-CM

## 2018-11-06 DIAGNOSIS — Z3A28 28 weeks gestation of pregnancy: Secondary | ICD-10-CM

## 2018-11-06 DIAGNOSIS — G40909 Epilepsy, unspecified, not intractable, without status epilepticus: Secondary | ICD-10-CM

## 2018-11-06 DIAGNOSIS — O0992 Supervision of high risk pregnancy, unspecified, second trimester: Secondary | ICD-10-CM | POA: Insufficient documentation

## 2018-11-06 DIAGNOSIS — F17200 Nicotine dependence, unspecified, uncomplicated: Secondary | ICD-10-CM | POA: Insufficient documentation

## 2018-11-06 DIAGNOSIS — O99343 Other mental disorders complicating pregnancy, third trimester: Secondary | ICD-10-CM | POA: Diagnosis not present

## 2018-11-06 DIAGNOSIS — O99323 Drug use complicating pregnancy, third trimester: Secondary | ICD-10-CM | POA: Insufficient documentation

## 2018-11-06 DIAGNOSIS — O98813 Other maternal infectious and parasitic diseases complicating pregnancy, third trimester: Secondary | ICD-10-CM

## 2018-11-06 DIAGNOSIS — A749 Chlamydial infection, unspecified: Secondary | ICD-10-CM

## 2018-11-06 NOTE — Progress Notes (Signed)
   PRENATAL VISIT NOTE  Subjective:  Valoree Agent is a 28 y.o. [id number] at [redacted]w[redacted]d being seen today for ongoing prenatal care.  She is currently monitored for the following issues for this high-risk pregnancy and has History of seizures; Severe recurrent major depression without psychotic features ([place]); Bipolar 1 disorder ([place]); Poor social situation; Pituitary microadenoma [facility]); and Supervision of high risk pregnancy in second trimester on their problem list.  Attempted to review pt health history and social/living situation today. The pt is very upset at today's visit because she cannot get into her MyChart on her phone.  She did confirm that she has a safe living situation in a house in [place].  She does not know the city/town in [place]. It is a friend's house.  Eventually, by May or June, he will be able to drive her to appointments but she currently has limited transportation.  Her grandparents are here today and drove her from [place] to [place] and will take her to [facility] for her [place] after the appointment.  She reports she walked from [place] to [place] starting early Sunday morning.  She became agitated at questioning and reports she only wants help getting onto her MyChart. She reports she also became upset and stopped prenatal care with her 28 year old.   Contractions: Not present. Vag. Bleeding: None.  Movement: Present. Denies leaking of fluid.   The following portions of the patient's history were reviewed and updated as appropriate: allergies, current medications, past family history, past medical history, past social history, past surgical history and problem list.   Objective:   Vitals:   11/06/18 1027  Weight: 130.6 kg    Fetal Status:     Movement: Present     General:  Alert, ,oriented, but anxious/agitated. Patient is in no acute distress.  Skin: Skin is warm and dry. No rash noted.   Cardiovascular: Normal heart rate noted  Respiratory: Normal  respiratory effort, no problems with respiration noted  Abdomen: Soft, gravid, appropriate for gestational age.  Pain/Pressure: Absent     Pelvic: Cervical exam deferred        Extremities: Normal range of motion.  Edema: None  Mental Status: Normal mood and affect. Normal behavior. Normal judgment and thought content.   Assessment and Plan:  Pregnancy: [id number] at [redacted]w[redacted]d 1. Supervision of high risk pregnancy, antepartum --Unable to do physical exam today with pt anxiety.   --28 week labs today.  Pt will go down to lab and return to office afterwards. --Appt made for pt to see Kittie Plater, integrated [place] at [facility] after her [place] appointment today. Notified pt of appt if she desires to go. --Return in 2 weeks.  Preterm labor symptoms and general obstetric precautions including but not limited to vaginal bleeding, contractions, leaking of fluid and fetal movement were reviewed in detail with the patient. Please refer to After Visit Summary for other counseling recommendations.   No follow-ups on file.  Future Appointments  Date Time Provider [place]  11/06/2018 12:45 PM [place] 2 WH-MFCUS MFC-US    Fatima Blank, CNM
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
## 2018-11-06 NOTE — Patient Instructions (Signed)
Perinatal Anxiety When a woman feels excessive tension or worry (anxiety) during pregnancy or during the first 12 months after she gives birth, she has a condition called perinatal anxiety. Anxiety can interfere with work, school, relationships, and other everyday activities. If it is not managed properly, it can also cause problems in the mother and her baby.  If you are pregnant and you have symptoms of an anxiety disorder, it is important to talk with your health care provider. What are the causes? The exact cause of this condition is not known. Hormonal changes during and after pregnancy may play a role in causing perinatal anxiety. What increases the risk? You are more likely to develop this condition if:  You have a personal or family history of depression, anxiety, or mood disorders.  You experience a stressful life event during pregnancy, such as the death of a loved one.  You have a lot of regular life stress, such as being a single parent.  You have thyroid problems. What are the signs or symptoms? Perinatal anxiety can be different for everyone. It may include:  Panic attacks (panic disorder). These are intense episodes of fear or discomfort that may also cause sweating, nausea, shortness of breath, or fear of dying. They usually last 5-15 minutes.  Reliving an upsetting (traumatic) event through distressing thoughts, dreams, or flashbacks (post-traumatic stress disorder, or PTSD).  Excessive worry about multiple problems (generalized anxiety disorder).  Fear and stress about leaving certain people or loved ones (separation anxiety).  Performing repetitive tasks (compulsions) to relieve stress or worry (obsessive compulsive disorder, or OCD).  Fear of certain objects or situations (phobias).  Excessive worrying, such as a constant feeling that something bad is going to happen.  Inability to relax.  Difficulty concentrating.  Sleep problems.  Frequent nightmares or  disturbing thoughts. How is this diagnosed? This condition is diagnosed based on a physical exam and mental evaluation. In some cases, your health care provider may use an anxiety screening tool. These tools include a list of questions that can help a health care provider diagnose anxiety. Your health care provider may refer you to a mental health expert who specializes in anxiety. How is this treated? This condition may be treated with:  Medicines. Your health care provider will only give you medicines that have been proven safe for pregnancy and breastfeeding.  Talk therapy with a mental health professional to help change your patterns of thinking (cognitive behavioral therapy).  Mindfulness-based stress reduction.  Other relaxation therapies, such as deep breathing or guided muscle relaxation.  Support groups. Follow these instructions at home: Lifestyle  Do not use any products that contain nicotine or tobacco, such as cigarettes and e-cigarettes. If you need help quitting, ask your health care provider.  Do not use alcohol when you are pregnant. After your baby is born, limit alcohol intake to no more than 1 drink a day. One drink equals 12 oz of beer, 5 oz of wine, or 1 oz of hard liquor.  Consider joining a support group for new mothers. Ask your health care provider for recommendations.  Take good care of yourself. Make sure you: ? Get plenty of sleep. If you are having trouble sleeping, talk with your health care provider. ? Eat a healthy diet. This includes plenty of fruits and vegetables, whole grains, and lean proteins. ? Exercise regularly, as told by your health care provider. Ask your health care provider what exercises are safe for you. General instructions  Take over-the-counter  and prescription medicines only as told by your health care provider. °· Talk with your partner or family members about your feelings during pregnancy. Share any concerns or fears that you may  have. °· Ask for help with tasks or chores when you need it. Ask friends and family members to provide meals, watch your children, or help with cleaning. °· Keep all follow-up visits as told by your health care provider. This is important. °Contact a health care provider if: °· You (or people close to you) notice that you have any symptoms of anxiety or depression. °· You have anxiety and your symptoms get worse. °· You experience side effects from medicines, such as nausea or sleep problems. °Get help right away if: °· You feel like hurting yourself, your baby, or someone else. °If you ever feel like you may hurt yourself or others, or have thoughts about taking your own life, get help right away. You can go to your nearest emergency department or call: °· Your local emergency services (911 in the [place] °· A suicide crisis helpline, such as the National Suicide Prevention Lifeline at 1-800-273-8255. This is open 24 hours a day. °Summary °· Perinatal anxiety is when a woman feels excessive tension or worry during pregnancy or during the first 12 months after she gives birth. °· Perinatal anxiety may include panic attacks, post-traumatic stress disorder, separation anxiety, phobias, or generalized anxiety. °· Perinatal anxiety can cause physical health problems in the mother and baby if not properly managed. °· This condition is treated with medicines, talk therapy, stress reduction therapies, or a combination of two or more treatments. °· Talk with your partner or family members about your concerns or fears. Do not be afraid to ask for help. °This information is not intended to replace advice given to you by your health care provider. Make sure you discuss any questions you have with your health care provider. °Document Released: 10/12/2016 Document Revised: 10/12/2016 Document Reviewed: 10/12/2016 °Elsevier Interactive Patient Education © 2019 Elsevier Inc. ° °

## 2018-11-07 LAB — CBC
HCT: 37 % (ref 35.0–45.0)
Hemoglobin: 12.3 g/dL (ref 11.7–15.5)
MCH: 28 pg (ref 27.0–33.0)
MCHC: 33.2 g/dL (ref 32.0–36.0)
MCV: 84.1 fL (ref 80.0–100.0)
MPV: 11.1 fL (ref 7.5–12.5)
Platelets: 282 10*3/uL (ref 140–400)
RBC: 4.4 10*6/uL (ref 3.80–5.10)
RDW: 12.5 % (ref 11.0–15.0)
WBC: 10.6 10*3/uL (ref 3.8–10.8)

## 2018-11-07 LAB — RPR: RPR Ser Ql: NONREACTIVE

## 2018-11-07 LAB — 2HR GTT W 1 HR, CARPENTER, 75 G
Glucose, 1 Hr, Gest: 100 mg/dL (ref 65–179)
Glucose, 2 Hr, Gest: 99 mg/dL (ref 65–152)
Glucose, Fasting, Gest: 76 mg/dL (ref 65–91)

## 2018-11-07 LAB — HIV ANTIBODY (ROUTINE TESTING W REFLEX): HIV 1&2 Ab, 4th Generation: NONREACTIVE

## 2018-11-20 ENCOUNTER — Telehealth: Payer: Self-pay

## 2018-11-20 DIAGNOSIS — Z3480 Encounter for supervision of other normal pregnancy, unspecified trimester: Secondary | ICD-10-CM

## 2018-11-20 NOTE — Telephone Encounter (Signed)
Attempted to call pt to let her know that her appt on 3/25 will be a telephone visit (due to COVID-19) and to discuss Babyscripts with her per Dr.Dove. The mobile number we have for her is invalid. I called boyfriend's number on file but he is not on her DPR so I could not give him any information. He said he will have pt call the office. I have sent the pt a MyChart message letting her know about appt and Babyscripts.

## 2018-11-20 NOTE — Telephone Encounter (Signed)
Spoke with pt and she is aware of telephone visit on 3/25 and Babyscripts enrollment. Pt states she has been having chest pain and has been seen at [facility] lately and recently went last nigh but, states they are not helping her. I suggested she call PCP or be seen at the [facility]. Pt states [facility] is closest to her. I have sent a message to Fatima Blank, CNM.

## 2018-11-21 ENCOUNTER — Telehealth: Payer: Self-pay | Admitting: Clinical

## 2018-11-21 ENCOUNTER — Ambulatory Visit (INDEPENDENT_AMBULATORY_CARE_PROVIDER_SITE_OTHER): Payer: Medicaid Other | Admitting: Obstetrics and Gynecology

## 2018-11-21 ENCOUNTER — Other Ambulatory Visit: Payer: Self-pay

## 2018-11-21 DIAGNOSIS — O99343 Other mental disorders complicating pregnancy, third trimester: Secondary | ICD-10-CM

## 2018-11-21 DIAGNOSIS — Z3A30 30 weeks gestation of pregnancy: Secondary | ICD-10-CM

## 2018-11-21 DIAGNOSIS — O0992 Supervision of high risk pregnancy, unspecified, second trimester: Secondary | ICD-10-CM

## 2018-11-21 DIAGNOSIS — Z659 Problem related to unspecified psychosocial circumstances: Secondary | ICD-10-CM

## 2018-11-21 DIAGNOSIS — Z3480 Encounter for supervision of other normal pregnancy, unspecified trimester: Secondary | ICD-10-CM

## 2018-11-21 DIAGNOSIS — F332 Major depressive disorder, recurrent severe without psychotic features: Secondary | ICD-10-CM

## 2018-11-21 NOTE — Telephone Encounter (Signed)
Follow-up call, per referral from [name], NP.   [phone]) called at 2:10pm; "call cannot be completed at this time".   [phone]) called at 2:12pm; woman answers "she's not here no more". No message left.   Left MyChart message for patient to call back at [phone] to schedule an appointment to see Roselyn Reef, the [place] [facility] [place] [facility].  Pt may call [phone] to schedule for a Virtual Telehealth visit with [facility] Jaliyah Fotheringham at her convenience.
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
## 2018-11-22 ENCOUNTER — Telehealth: Payer: Self-pay | Admitting: Clinical

## 2018-11-22 ENCOUNTER — Ambulatory Visit (INDEPENDENT_AMBULATORY_CARE_PROVIDER_SITE_OTHER): Payer: Medicaid Other | Admitting: Clinical

## 2018-11-22 DIAGNOSIS — Z658 Other specified problems related to psychosocial circumstances: Secondary | ICD-10-CM

## 2018-11-22 DIAGNOSIS — F319 Bipolar disorder, unspecified: Secondary | ICD-10-CM
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
## 2018-11-22 NOTE — Telephone Encounter (Signed)
error 

## 2018-12-04 ENCOUNTER — Telehealth: Payer: Self-pay

## 2018-12-04 NOTE — Telephone Encounter (Signed)
Returning pt call. Pt states she has had inconsistent spotting for the past four days. Denies recent intercourse. Denies contractions, back pain and any other symptoms. Pt states baby is moving well. I instructed pt to report to [facility] if bleeding gets heavy. Pt states understanding.

## 2018-12-05 ENCOUNTER — Ambulatory Visit (HOSPITAL_COMMUNITY): Payer: Medicaid Other

## 2018-12-08 ENCOUNTER — Other Ambulatory Visit: Payer: Self-pay

## 2018-12-08 ENCOUNTER — Emergency Department (HOSPITAL_BASED_OUTPATIENT_CLINIC_OR_DEPARTMENT_OTHER)
Admission: EM | Admit: 2018-12-08 | Discharge: 2018-12-08 | Discharge status: Against Medical Advice | Payer: Medicaid Other | Attending: Emergency Medicine | Admitting: Emergency Medicine

## 2018-12-08 DIAGNOSIS — Z3A32 32 weeks gestation of pregnancy: Secondary | ICD-10-CM | POA: Diagnosis not present

## 2018-12-08 DIAGNOSIS — Z91040 Latex allergy status: Secondary | ICD-10-CM | POA: Diagnosis not present

## 2018-12-08 DIAGNOSIS — O26899 Other specified pregnancy related conditions, unspecified trimester: Secondary | ICD-10-CM

## 2018-12-08 DIAGNOSIS — F17210 Nicotine dependence, cigarettes, uncomplicated: Secondary | ICD-10-CM | POA: Diagnosis not present

## 2018-12-08 DIAGNOSIS — R109 Unspecified abdominal pain: Secondary | ICD-10-CM | POA: Insufficient documentation

## 2018-12-08 DIAGNOSIS — J45909 Unspecified asthma, uncomplicated: Secondary | ICD-10-CM | POA: Diagnosis not present

## 2018-12-08 DIAGNOSIS — Z79899 Other long term (current) drug therapy: Secondary | ICD-10-CM | POA: Diagnosis not present

## 2018-12-08 DIAGNOSIS — O99331 Smoking (tobacco) complicating pregnancy, first trimester: Secondary | ICD-10-CM | POA: Diagnosis not present

## 2018-12-08 NOTE — Progress Notes (Signed)
Spoke with [employer]. FHR not tracing. Pt has taken off her fetal monitor and has left AMA.

## 2018-12-08 NOTE — ED Notes (Signed)
Attempted to place pt on toco. She is verbally aggressive and refuses to cooperate with positioning. Unable to get correct placement at this time.
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
## 2018-12-08 NOTE — ED Notes (Signed)
Pt removed the toco monitor, says we are taking too long and she cannot lay down due to pain.

## 2018-12-08 NOTE — ED Notes (Addendum)
Refuses to lay back due to back trouble' and unable to apply TOCO, ED  MD at bedside

## 2018-12-08 NOTE — ED Notes (Signed)
Pt asked to speak with the charge nurse and advised me we are not giving her proper care. The toco is laying on the floor. We are slow and she doesn't feel safe here and she is leaving. I asked her what care does she think we are not providing and she states she has been hooked up to the monitor and we are not doing anything. I explained to her that we have to wait for the [facility] to [facility] the monitor. She states she is leaving and reporting [place]. Pt got dressed and left the department.

## 2018-12-08 NOTE — ED Notes (Signed)
Texting on phone, NAD noted

## 2018-12-08 NOTE — Progress Notes (Signed)
Received call from [facility]. Pt is a G9P1 at 32 4/[redacted] weeks gestation presenting with c/o pelvic pain. Pt gets her care at [facility]. No vaginal bleeding or leaking of fluid.

## 2018-12-08 NOTE — ED Notes (Addendum)
States," When my back hurts ,I will be sitting up and taking that  Monitor off  and I know I  will have a attitude"

## 2018-12-08 NOTE — ED Notes (Signed)
ED Provider at bedside. 

## 2018-12-08 NOTE — ED Notes (Addendum)
Refuses to to have cardiac monitor leads applied," I am allergic to adhesives and ya'll are not going to be doing that"

## 2018-12-08 NOTE — ED Notes (Signed)
Spoke with Stanton Kidney from rapid response at [employer]

## 2018-12-08 NOTE — ED Triage Notes (Addendum)
"   I have been having severe pelvic pain since 0200 this am" Did not have a ride to MAU, Gravida 9, Para 1, AB  7. [facility] [place]

## 2018-12-11 ENCOUNTER — Ambulatory Visit (INDEPENDENT_AMBULATORY_CARE_PROVIDER_SITE_OTHER): Payer: Medicaid Other | Admitting: Certified Nurse Midwife

## 2018-12-11 ENCOUNTER — Other Ambulatory Visit: Payer: Self-pay

## 2018-12-11 DIAGNOSIS — O0992 Supervision of high risk pregnancy, unspecified, second trimester: Secondary | ICD-10-CM

## 2018-12-11 DIAGNOSIS — Z659 Problem related to unspecified psychosocial circumstances: Secondary | ICD-10-CM

## 2018-12-11 DIAGNOSIS — Z3A33 33 weeks gestation of pregnancy: Secondary | ICD-10-CM

## 2018-12-11 DIAGNOSIS — O99343 Other mental disorders complicating pregnancy, third trimester: Secondary | ICD-10-CM | POA: Diagnosis not present

## 2018-12-11 DIAGNOSIS — F319 Bipolar disorder, unspecified: Secondary | ICD-10-CM

## 2018-12-11 NOTE — Progress Notes (Signed)
Pt c/o pelvic pain and white discharge for two weeks

## 2018-12-12 ENCOUNTER — Telehealth: Payer: Self-pay | Admitting: Licensed Clinical Social Worker
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
## 2018-12-12 NOTE — Telephone Encounter (Signed)
CNW V. Stann Mainland referred pt to CSW A.Linton Rump to locate community resources to assist pt located safe shelter due to currently being homeless. A Linton Rump called pt cell phone left detailed message regarding available resources. [facility] [phone] is willing to collaborate with pt on locating safe shelter.

## 2018-12-14 ENCOUNTER — Telehealth: Payer: Self-pay | Admitting: Licensed Clinical Social Worker

## 2018-12-14 ENCOUNTER — Telehealth: Payer: Self-pay

## 2018-12-14 NOTE — Telephone Encounter (Signed)
Patient called the office.  Patient states she needs to speak to [place] "because she knows the situation" Made her aware that office is not open and I could get a message to [place].   Patient states that the "social worker doesn't care I am homeless" and isnt helping her.  I let patient know I could give message to veronica and could she verify her phone number.  Patient then proceeded she say she needs to talk to her today because she doesn't have time to wait. I informed patient I will give her the message and if she doesn't hear back from [place] today she could call the office again on Monday.  Patient then replied with explicit comments and hung up.

## 2018-12-14 NOTE — Telephone Encounter (Signed)
CSW A. Linton Rump returned Tina Wood phone call. I tried to provide Tina Wood with additional resources located in [place], [place] that can assist her in locating safe shelter. Room at the [place] and [facility] [phone] Tina Wood refused to take the information because it is located in [place]. Tina Wood reports she has to stay in [facility] and there are no shelters that will take her. I explained to Tina Wood because of current events with covid-19 many places are full or limiting the amount of individuals.

## 2018-12-26 ENCOUNTER — Telehealth: Payer: Self-pay | Admitting: *Deleted

## 2018-12-26 NOTE — Telephone Encounter (Signed)
Patient called to request medical records be sent to [facility] in [place], [place]. Explained to patient that a medical release has to be signed in their office there since I know she can't get here to sign one and they can fax it to [place]. Also told patient that that office can mail her a form, would be another quick way. Patient did not give me a chance to tell her the other different ways that she could obtain the records before she hung up on me. Patient was sent a MyChart message to request her records through her [place].

## 2018-12-27 ENCOUNTER — Encounter: Payer: Self-pay | Admitting: Obstetrics & Gynecology

## 2018-12-31 ENCOUNTER — Telehealth: Payer: Self-pay
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
## 2019-01-02 ENCOUNTER — Ambulatory Visit (HOSPITAL_COMMUNITY): Payer: Medicaid Other

## 2019-01-04 ENCOUNTER — Telehealth: Payer: Medicaid Other | Admitting: Family

## 2019-01-04 ENCOUNTER — Inpatient Hospital Stay: Admission: RE | Admit: 2019-01-04 | Payer: Medicaid Other | Source: Ambulatory Visit

## 2019-01-04 DIAGNOSIS — N898 Other specified noninflammatory disorders of vagina: Secondary | ICD-10-CM

## 2019-01-04 DIAGNOSIS — N9419 Other specified dyspareunia: Secondary | ICD-10-CM

## 2019-01-04 DIAGNOSIS — R1084 Generalized abdominal pain: Secondary | ICD-10-CM

## 2019-01-04 DIAGNOSIS — R102 Pelvic and perineal pain unspecified side: Secondary | ICD-10-CM

## 2019-01-04 NOTE — Progress Notes (Signed)
Thank you for the details you included in the comment boxes. Those details are very helpful in determining the best course of treatment for you and help [place] to provide the best care.  Based on your symptoms with pain during intercourse, belly pain, and green/yellow discharge, you can only be treated face-to-face with a full physical exam. This is the standard of care for your condition. See below.  Based on what you shared with me, I feel your condition warrants further evaluation and I recommend that you be seen for a face to face office visit.     NOTE: If you entered your credit card information for this eVisit, you will not be charged. You may see a "hold" on your card for the $35 but that hold will drop off and you will not have a charge processed.  If you are having a true medical emergency please call 911.  If you need an urgent face to face visit, [facility] has four urgent care centers for your convenience.    PLEASE NOTE: THE [facility] LOCATIONS AND URGENT CARE CLINICS DO NOT HAVE THE TESTING FOR CORONAVIRUS COVID19 AVAILABLE.  IF YOU FEEL YOU NEED THIS TEST YOU MUST GO TO A TRIAGE LOCATION AT [facility]   [email or web address] to reserve your spot online an avoid wait times  [facility] [address] [place], [place] [address] Modified hours of operation: Monday-Friday, 12 PM to 6 PM  Saturday & Sunday 10 AM to 4 PM *Across the street from [place] (New Address!) [address] [place], [place] [address] *Just off [address], across the road from [place] hours of operation: Monday-Friday, 12 PM to 6 PM  Closed Saturday & Sunday  [facility]'s modified hours of operation will be in effect from May 1 until May 31   The following sites will take your insurance:  . [facility] [place] a Provider at  this Location  [address] [place], [place] [address] . 10 am to 8 pm Monday-Friday . 12 pm to 8 pm Saturday-Sunday   . [facility] at [place] a Provider at this Location  [place], [place], [place] [address] . 8 am to 8 pm Monday-Friday . 9 am to 6 pm Saturday . 11 am to 6 pm Sunday   . [facility] at [place] Get Driving Directions  [address] [place], [place] [address] . 8 am to 8 pm Monday-Friday . 8 am to 4 pm Saturday-Sunday   Your e-visit answers were reviewed by a board certified advanced clinical practitioner to complete your personal care plan.  Thank you for using e-Visits.

## 2019-01-07 ENCOUNTER — Other Ambulatory Visit (HOSPITAL_COMMUNITY)
Admission: RE | Admit: 2019-01-07 | Discharge: 2019-01-07 | Disposition: A | Discharge status: Home / Self Care | Payer: Medicaid Other | Source: Ambulatory Visit | Attending: Obstetrics & Gynecology | Admitting: Obstetrics & Gynecology

## 2019-01-07 ENCOUNTER — Other Ambulatory Visit: Payer: Self-pay

## 2019-01-07 ENCOUNTER — Ambulatory Visit (INDEPENDENT_AMBULATORY_CARE_PROVIDER_SITE_OTHER): Payer: Medicaid Other | Admitting: Obstetrics & Gynecology

## 2019-01-07 VITALS — BP 105/68 | HR 88 | Wt 288.0 lb

## 2019-01-07 DIAGNOSIS — O0992 Supervision of high risk pregnancy, unspecified, second trimester: Secondary | ICD-10-CM | POA: Insufficient documentation

## 2019-01-07 DIAGNOSIS — Z3A36 36 weeks gestation of pregnancy: Secondary | ICD-10-CM

## 2019-01-07 DIAGNOSIS — Z23 Encounter for immunization: Secondary | ICD-10-CM
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
## 2019-01-07 NOTE — Progress Notes (Signed)
Pt states she "lost clear fluid this morning"

## 2019-01-08 LAB — CERVICOVAGINAL ANCILLARY ONLY
Bacterial vaginitis: NEGATIVE
Candida vaginitis: NEGATIVE
Chlamydia: NEGATIVE
Neisseria Gonorrhea: NEGATIVE
Trichomonas: NEGATIVE

## 2019-01-10 ENCOUNTER — Telehealth (HOSPITAL_COMMUNITY): Payer: Self-pay | Admitting: *Deleted

## 2019-01-10 LAB — CULTURE, BETA STREP (GROUP B ONLY)
MICRO NUMBER:: 462475
SPECIMEN QUALITY:: ADEQUATE

## 2019-01-10 NOTE — Telephone Encounter (Signed)
p 

## 2019-01-11 ENCOUNTER — Telehealth (HOSPITAL_COMMUNITY): Payer: Self-pay | Admitting: *Deleted

## 2019-01-11 ENCOUNTER — Encounter (HOSPITAL_COMMUNITY): Payer: Self-pay | Admitting: *Deleted

## 2019-01-11 NOTE — Telephone Encounter (Signed)
Preadmission screen  

## 2019-01-14 ENCOUNTER — Ambulatory Visit (INDEPENDENT_AMBULATORY_CARE_PROVIDER_SITE_OTHER): Payer: Medicaid Other | Admitting: Obstetrics & Gynecology

## 2019-01-14 ENCOUNTER — Other Ambulatory Visit: Payer: Self-pay | Admitting: Obstetrics & Gynecology

## 2019-01-14 ENCOUNTER — Other Ambulatory Visit: Payer: Self-pay

## 2019-01-14 VITALS — BP 107/63 | HR 84 | Wt 284.0 lb

## 2019-01-14 DIAGNOSIS — Z3A37 37 weeks gestation of pregnancy: Secondary | ICD-10-CM

## 2019-01-14 DIAGNOSIS — F319 Bipolar disorder, unspecified: Secondary | ICD-10-CM

## 2019-01-14 DIAGNOSIS — Z659 Problem related to unspecified psychosocial circumstances: Secondary | ICD-10-CM

## 2019-01-14 DIAGNOSIS — O0992 Supervision of high risk pregnancy, unspecified, second trimester: Secondary | ICD-10-CM

## 2019-01-14 DIAGNOSIS — O99343 Other mental disorders complicating pregnancy, third trimester: Secondary | ICD-10-CM
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
## 2019-01-15 ENCOUNTER — Other Ambulatory Visit: Payer: Self-pay | Admitting: Advanced Practice Midwife

## 2019-01-21 ENCOUNTER — Other Ambulatory Visit: Payer: Self-pay

## 2019-01-21 ENCOUNTER — Other Ambulatory Visit (HOSPITAL_COMMUNITY)
Admission: RE | Admit: 2019-01-21 | Discharge: 2019-01-21 | Disposition: A | Discharge status: Home / Self Care | Payer: Medicaid Other | Source: Ambulatory Visit | Attending: Obstetrics & Gynecology | Admitting: Obstetrics & Gynecology

## 2019-01-21 DIAGNOSIS — Z1159 Encounter for screening for other viral diseases: Secondary | ICD-10-CM | POA: Insufficient documentation

## 2019-01-21 NOTE — MAU Note (Signed)
Some resp issues-  Normal for her from asthma, no new symptoms.  Swab collected.

## 2019-01-22 ENCOUNTER — Other Ambulatory Visit (HOSPITAL_COMMUNITY): Payer: Self-pay | Admitting: *Deleted

## 2019-01-22 LAB — NOVEL CORONAVIRUS, NAA (HOSP ORDER, SEND-OUT TO REF LAB; TAT 18-24 HRS): SARS-CoV-2, NAA: NOT DETECTED

## 2019-01-23 ENCOUNTER — Inpatient Hospital Stay (HOSPITAL_COMMUNITY): Payer: Medicaid Other | Admitting: Anesthesiology

## 2019-01-23 ENCOUNTER — Other Ambulatory Visit: Payer: Self-pay

## 2019-01-23 ENCOUNTER — Inpatient Hospital Stay (HOSPITAL_COMMUNITY)
Admission: AD | Admit: 2019-01-23 | Discharge: 2019-01-25 | DRG: 807 | Disposition: A | Discharge status: Home / Self Care | Payer: Medicaid Other | Attending: Family Medicine | Admitting: Family Medicine

## 2019-01-23 ENCOUNTER — Encounter (HOSPITAL_COMMUNITY): Payer: Self-pay | Admitting: Anesthesiology

## 2019-01-23 ENCOUNTER — Encounter (HOSPITAL_COMMUNITY): Payer: Self-pay

## 2019-01-23 ENCOUNTER — Inpatient Hospital Stay (HOSPITAL_COMMUNITY): Payer: Medicaid Other

## 2019-01-23 DIAGNOSIS — Z23 Encounter for immunization: Secondary | ICD-10-CM

## 2019-01-23 DIAGNOSIS — Z3A39 39 weeks gestation of pregnancy: Secondary | ICD-10-CM

## 2019-01-23 DIAGNOSIS — O99334 Smoking (tobacco) complicating childbirth: Secondary | ICD-10-CM | POA: Diagnosis present

## 2019-01-23 DIAGNOSIS — F332 Major depressive disorder, recurrent severe without psychotic features: Secondary | ICD-10-CM | POA: Diagnosis present

## 2019-01-23 DIAGNOSIS — Z86011 Personal history of benign neoplasm of the brain: Secondary | ICD-10-CM

## 2019-01-23 DIAGNOSIS — O99344 Other mental disorders complicating childbirth: Secondary | ICD-10-CM | POA: Diagnosis present

## 2019-01-23 DIAGNOSIS — O99214 Obesity complicating childbirth: Secondary | ICD-10-CM | POA: Diagnosis present

## 2019-01-23 DIAGNOSIS — F419 Anxiety disorder, unspecified: Secondary | ICD-10-CM | POA: Diagnosis present

## 2019-01-23 DIAGNOSIS — F17200 Nicotine dependence, unspecified, uncomplicated: Secondary | ICD-10-CM | POA: Diagnosis present

## 2019-01-23 DIAGNOSIS — F319 Bipolar disorder, unspecified: Secondary | ICD-10-CM | POA: Diagnosis present

## 2019-01-23 DIAGNOSIS — Z87898 Personal history of other specified conditions: Secondary | ICD-10-CM

## 2019-01-23 DIAGNOSIS — D352 Benign neoplasm of pituitary gland: Secondary | ICD-10-CM | POA: Diagnosis present

## 2019-01-23 DIAGNOSIS — Z609 Problem related to social environment, unspecified: Secondary | ICD-10-CM

## 2019-01-23 DIAGNOSIS — F1291 Cannabis use, unspecified, in remission: Secondary | ICD-10-CM

## 2019-01-23 DIAGNOSIS — Z8619 Personal history of other infectious and parasitic diseases: Secondary | ICD-10-CM

## 2019-01-23 DIAGNOSIS — T7421XS Adult sexual abuse, confirmed, sequela: Secondary | ICD-10-CM

## 2019-01-23 DIAGNOSIS — Z30430 Encounter for insertion of intrauterine contraceptive device: Secondary | ICD-10-CM

## 2019-01-23 DIAGNOSIS — F17210 Nicotine dependence, cigarettes, uncomplicated: Secondary | ICD-10-CM | POA: Diagnosis present

## 2019-01-23 DIAGNOSIS — Z659 Problem related to unspecified psychosocial circumstances: Secondary | ICD-10-CM

## 2019-01-23 DIAGNOSIS — O26893 Other specified pregnancy related conditions, third trimester: Secondary | ICD-10-CM | POA: Diagnosis present

## 2019-01-23 DIAGNOSIS — O99323 Drug use complicating pregnancy, third trimester: Secondary | ICD-10-CM | POA: Diagnosis present

## 2019-01-23 LAB — RPR: RPR Ser Ql: NONREACTIVE

## 2019-01-23 LAB — TYPE AND SCREEN
ABO/RH(D): A POS
Antibody Screen: NEGATIVE

## 2019-01-23 LAB — RAPID URINE DRUG SCREEN, HOSP PERFORMED
Amphetamines: NOT DETECTED
Barbiturates: NOT DETECTED
Benzodiazepines: NOT DETECTED
Cocaine: NOT DETECTED
Opiates: NOT DETECTED
Tetrahydrocannabinol: NOT DETECTED

## 2019-01-23 LAB — CBC
HCT: 37.1 % (ref 36.0–46.0)
Hemoglobin: 11.8 g/dL — ABNORMAL LOW (ref 12.0–15.0)
MCH: 25.6 pg — ABNORMAL LOW (ref 26.0–34.0)
MCHC: 31.8 g/dL (ref 30.0–36.0)
MCV: 80.5 fL (ref 80.0–100.0)
Platelets: 288 10*3/uL (ref 150–400)
RBC: 4.61 MIL/uL (ref 3.87–5.11)
RDW: 13.4 % (ref 11.5–15.5)
WBC: 13.5 10*3/uL — ABNORMAL HIGH (ref 4.0–10.5)
nRBC: 0 % (ref 0.0–0.2)

## 2019-01-23 MED ORDER — FENTANYL CITRATE (PF) 100 MCG/2ML IJ SOLN
100.0000 ug | INTRAMUSCULAR | Status: DC | PRN
  Administered 2019-01-23: 100 ug via INTRAVENOUS
  Filled 2019-01-23: qty 2

## 2019-01-23 MED ORDER — FENTANYL-BUPIVACAINE-NACL 0.5-0.125-0.9 MG/250ML-% EP SOLN
12.0000 mL/h | EPIDURAL | Status: DC | PRN
  Filled 2019-01-23: qty 250

## 2019-01-23 MED ORDER — TERBUTALINE SULFATE 1 MG/ML IJ SOLN: 0.2500 mg | Freq: Once | INTRAMUSCULAR | Status: DC | PRN

## 2019-01-23 MED ORDER — PHENYLEPHRINE 40 MCG/ML (10ML) SYRINGE FOR IV PUSH (FOR BLOOD PRESSURE SUPPORT)
80.0000 ug | PREFILLED_SYRINGE | INTRAVENOUS | Status: DC | PRN
  Filled 2019-01-23: qty 10

## 2019-01-23 MED ORDER — EPHEDRINE 5 MG/ML INJ: 10.0000 mg | INTRAVENOUS | Status: DC | PRN

## 2019-01-23 MED ORDER — OXYTOCIN 40 UNITS IN NORMAL SALINE INFUSION - SIMPLE MED: 2.5000 [IU]/h | INTRAVENOUS | Status: DC

## 2019-01-23 MED ORDER — OXYCODONE-ACETAMINOPHEN 5-325 MG PO TABS: 1.0000 | ORAL_TABLET | ORAL | Status: DC | PRN

## 2019-01-23 MED ORDER — ONDANSETRON HCL 4 MG/2ML IJ SOLN
4.0000 mg | Freq: Four times a day (QID) | INTRAMUSCULAR | Status: DC | PRN
  Administered 2019-01-23: 4 mg via INTRAVENOUS
  Filled 2019-01-23: qty 2

## 2019-01-23 MED ORDER — OXYCODONE-ACETAMINOPHEN 5-325 MG PO TABS: 2.0000 | ORAL_TABLET | ORAL | Status: DC | PRN

## 2019-01-23 MED ORDER — NALBUPHINE HCL 10 MG/ML IJ SOLN
10.0000 mg | Freq: Four times a day (QID) | INTRAMUSCULAR | Status: DC | PRN
  Administered 2019-01-23: 10 mg via INTRAVENOUS
  Filled 2019-01-23: qty 1

## 2019-01-23 MED ORDER — SOD CITRATE-CITRIC ACID 500-334 MG/5ML PO SOLN: 30.0000 mL | ORAL | Status: DC | PRN

## 2019-01-23 MED ORDER — PHENYLEPHRINE 40 MCG/ML (10ML) SYRINGE FOR IV PUSH (FOR BLOOD PRESSURE SUPPORT)
80.0000 ug | PREFILLED_SYRINGE | INTRAVENOUS | Status: DC | PRN
  Administered 2019-01-23: 80 ug via INTRAVENOUS

## 2019-01-23 MED ORDER — DIPHENHYDRAMINE HCL 50 MG/ML IJ SOLN
12.5000 mg | INTRAMUSCULAR | Status: DC | PRN
  Administered 2019-01-23 (×2): 12.5 mg via INTRAVENOUS
  Filled 2019-01-23 (×2): qty 1

## 2019-01-23 MED ORDER — OXYTOCIN 40 UNITS IN NORMAL SALINE INFUSION - SIMPLE MED
1.0000 m[IU]/min | INTRAVENOUS | Status: DC
  Administered 2019-01-23: 2 m[IU]/min via INTRAVENOUS
  Filled 2019-01-23: qty 1000

## 2019-01-23 MED ORDER — LACTATED RINGERS IV SOLN
500.0000 mL | INTRAVENOUS | Status: DC | PRN
  Administered 2019-01-23: 500 mL via INTRAVENOUS

## 2019-01-23 MED ORDER — SODIUM CHLORIDE (PF) 0.9 % IJ SOLN
INTRAMUSCULAR | Status: DC | PRN
  Administered 2019-01-23: 12 mL/h via EPIDURAL

## 2019-01-23 MED ORDER — ACETAMINOPHEN 325 MG PO TABS: 650.0000 mg | ORAL_TABLET | ORAL | Status: DC | PRN

## 2019-01-23 MED ORDER — LIDOCAINE HCL (PF) 1 % IJ SOLN
INTRAMUSCULAR | Status: DC | PRN
  Administered 2019-01-23: 6 mL via EPIDURAL

## 2019-01-23 MED ORDER — LIDOCAINE HCL (PF) 1 % IJ SOLN: 30.0000 mL | INTRAMUSCULAR | Status: DC | PRN

## 2019-01-23 MED ORDER — LACTATED RINGERS IV SOLN
INTRAVENOUS | Status: DC
  Administered 2019-01-23 (×4): via INTRAVENOUS

## 2019-01-23 MED ORDER — OXYTOCIN BOLUS FROM INFUSION: 500.0000 mL | Freq: Once | INTRAVENOUS | Status: DC

## 2019-01-23 MED ORDER — MISOPROSTOL 50MCG HALF TABLET
50.0000 ug | ORAL_TABLET | ORAL | Status: DC
  Administered 2019-01-23: 50 ug via ORAL
  Filled 2019-01-23: qty 1

## 2019-01-23 MED ORDER — FLEET ENEMA 7-19 GM/118ML RE ENEM: 1.0000 | ENEMA | RECTAL | Status: DC | PRN

## 2019-01-23 MED ORDER — LACTATED RINGERS IV SOLN
500.0000 mL | Freq: Once | INTRAVENOUS | Status: AC
  Administered 2019-01-23: 500 mL via INTRAVENOUS

## 2019-01-23 NOTE — Anesthesia Preprocedure Evaluation (Signed)
Anesthesia Evaluation  Patient identified by MRN, date of birth, ID band Patient awake    Reviewed: Allergy & Precautions, H&P , NPO status , Patient's Chart, lab work & pertinent test results, reviewed documented beta blocker date and time   Airway Mallampati: II  TM Distance: >3 FB Neck ROM: full    Dental no notable dental hx. (+) Poor Dentition, Chipped, Missing   Pulmonary neg pulmonary ROS, Current Smoker,    Pulmonary exam normal breath sounds clear to auscultation       Cardiovascular negative cardio ROS Normal cardiovascular exam Rhythm:regular Rate:Normal     Neuro/Psych negative neurological ROS  negative psych ROS   GI/Hepatic negative GI ROS, Neg liver ROS,   Endo/Other  negative endocrine ROS  Renal/GU negative Renal ROS  negative genitourinary   Musculoskeletal   Abdominal   Peds  Hematology negative hematology ROS (+)   Anesthesia Other Findings   Reproductive/Obstetrics (+) Pregnancy                             Anesthesia Physical Anesthesia Plan  ASA: III  Anesthesia Plan: Epidural   Post-op Pain Management:    Induction:   PONV Risk Score and Plan:   Airway Management Planned:   Additional Equipment:   Intra-op Plan:   Post-operative Plan:   Informed Consent: I have reviewed the patients History and Physical, chart, labs and discussed the procedure including the risks, benefits and alternatives for the proposed anesthesia with the patient or authorized representative who has indicated his/her understanding and acceptance.     Dental Advisory Given  Plan Discussed with: CRNA, Anesthesiologist and Surgeon  Anesthesia Plan Comments: (Labs checked- platelets confirmed with RN in room. Fetal heart tracing, per RN, reported to be stable enough for sitting procedure. Discussed epidural, and patient consents to the procedure:  included risk of possible  headache,backache, failed block, allergic reaction, and nerve injury. This patient was asked if she had any questions or concerns before the procedure started.)        Anesthesia Quick Evaluation
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
## 2019-01-23 NOTE — Progress Notes (Signed)
Labor Progress Note Azhane Eckart is a 28 y.o. [phone] at [redacted]w[redacted]d presented for IOL for social reasons  S:  Feeling pain in lower abdomen, thinks its from FB, would like pain med  O:  BP 90/60   Pulse 73   Temp 98 F (36.7 C) (Oral)   Resp 18   Ht 5\' 7"  (1.702 m)   Wt 133.1 kg   LMP 04/24/2018 (Approximate) Comment: pt states she may be pregnant  BMI 45.95 kg/m  EFM: baseline 145 bpm/ mod variability/ + accels/ no decels  Toco: indeterminable SVE: Dilation: 1.5 Effacement (%): 70 Cervical Position: Middle Presentation: Vertex Exam by:: Carmelia Roller, CNM(foley bulb placement)   A/P: 28 y.o. B2W4132 [redacted]w[redacted]d  1. Labor: latent 2. FWB: Cat I 3. Pain: analgesia prn  S/p Cytotec and FB placement. Will give analgesic. Anticipate SVD.  Julianne Handler, CNM 8:42 AM

## 2019-01-23 NOTE — Progress Notes (Signed)
Labor Progress Note Tina Wood is a 28 y.o. [phone] at [redacted]w[redacted]d presented for IOL for social reasons  S:  Comfortable with epidural, requesting to be examined  O:  BP 99/63   Pulse (!) 57   Temp 98.2 F (36.8 C) (Oral)   Resp 16   Ht 5\' 7"  (1.702 m)   Wt 133.1 kg   LMP 04/24/2018 (Approximate) Comment: pt states she may be pregnant  BMI 45.95 kg/m  EFM: baseline 120 bpm/ mod variability/ + accels/ no decels  Toco: 2-4 SVE: Dilation: 6 Effacement (%): 60 Cervical Position: Middle Station: -2 Presentation: Vertex Exam by:: Colman Cater, CNM Pitocin: 14 mu/min  A/P: 28 y.o. A5W0981 [redacted]w[redacted]d  1. Labor: latent 2. FWB: Cat I 3. Pain: epidural  Continue Pitocin, AROM when more active and lower station. Anticipate SVD.  Julianne Handler, CNM 3:47 PM

## 2019-01-23 NOTE — Progress Notes (Signed)
Patient ID: Tina Wood, female   DOB: Feb 25, 1991, 28 y.o.   MRN: [id number]  Having some pressure, but overall comfortable with epidural. Reports itching even after Benadryl  BP 103/53, P 63 FHR 130-135, +accels, occ mi variables Ctx q 2-3 mins with Pit @ 29mu/min Cx 9/80/vtx 0; AROM for clear fluid  IUP@term  Active labor/transition  -Plan to check cx in next hour or 2 unless pt feels urge to push beforehand -Will order Nubain for itching -Anticipate SVD  Myrtis Ser [facility] 01/23/2019

## 2019-01-23 NOTE — Progress Notes (Signed)
CSW spoke with MOB and MOB's sister [name] at bedside. CSW explained role to MOB and expressed to MOB that CSW would be following back up with her once she has delivered. MOB understanding and still agreeable to answering CSW's questions at this moment. CSW congratulated MOB on pregnancy and advised MOB of why CSW was meeting with her at this time. MOB understanding and reported to CSW that she was and in some way is still having issues with housing. MOB reported that she has been staying with her sister [name], Courtney's significant other, and FOB at [address] [place]) . MOB reported that address listed on chart is her grandparents address. MOB expressed no plans to return to address listed in chart at this time. MOB reported that she has been staying in [place] for a little time but they are all worried about being evicted as the are awaiting an eviction notice. MOB expressed that before they lived at this address, another person lived there (perosn is now deceased). MOB and [name] expressed that if they are evicted they have already been searching for new housing at this time. CSW asked if MOB planned to stay in [place] as she reported that she was from [place] MOB reported that she is not sure of  at this time.   CSW notified by MOB that MOB has another daughter named [name]. MOB expressed that daughter lives with her MOB's father and step mother at this time. CSW sought further details on plans for this infant and MOB reported that she was debating about giving baby up for adoption as she had lost contact with her sister [name] and had no supports. At this time MOB reported that she has plans to keep infant.   CSW advised MOB that CSW would follow back up with her once she delievers to discuss other needs and potential barriers. MOB understanding and agreeable at this time.   CSW to follow back up with MOB once delivered and will contact  any needed services at that time.     [name] [name], MSW, LCSW-A [facility] [place] at [place] [phone]
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
## 2019-01-23 NOTE — Anesthesia Procedure Notes (Signed)
Epidural Patient location during procedure: OB Start time: 01/23/2019 1:24 PM End time: 01/23/2019 1:29 PM  Staffing Anesthesiologist: Janeece Riggers, MD  Preanesthetic Checklist Completed: patient identified, site marked, surgical consent, pre-op evaluation, timeout performed, IV checked, risks and benefits discussed and monitors and equipment checked  Epidural Patient position: sitting Prep: site prepped and draped and DuraPrep Patient monitoring: continuous pulse ox and blood pressure Approach: midline Location: L3-L4 Injection technique: LOR air  Needle:  Needle type: Tuohy  Needle gauge: 17 G Needle length: 9 cm and 9 Needle insertion depth: 5 cm cm Catheter type: closed end flexible Catheter size: 19 Gauge Catheter at skin depth: 10 cm Test dose: negative  Assessment Events: blood not aspirated, injection not painful, no injection resistance, negative IV test and no paresthesia

## 2019-01-23 NOTE — Progress Notes (Signed)
CSW went to speak with MOB at bedside to address further concerns and needs. CSW entered into room and was notified that MOB was using the restroom and it would be a little while. CSW agreed to try back later to speak with MOB.      [name] Kayson Tasker, MSW, LCSW-A [facility] [place] at [place] [phone]

## 2019-01-23 NOTE — Progress Notes (Signed)
Labor Progress Note Tina Wood is a 28 y.o. B5D9741 at [redacted]w[redacted]d presented for IOL for social reasons  S:  Comfortable, not feeling ctx, requesting epidural before pain starts.  O:  Const: appears irritated BP 104/62   Pulse 70   Temp 98.1 F (36.7 C) (Oral)   Resp 18   Ht 5\' 7"  (1.702 m)   Wt 133.1 kg   LMP 04/24/2018 (Approximate) Comment: pt states she may be pregnant  BMI 45.95 kg/m  EFM: baseline 130 bpm/ mod variability/ + accels/ no decels  Toco: irregular SVE: Dilation: 5 Effacement (%): 80 Cervical Position: Middle Station: -3 Presentation: Vertex Exam by:: Ignacia Felling, RN Pitocin: 2 mu/min  A/P: 28 y.o. U3A4536 [redacted]w[redacted]d  1. Labor: latent 2. FWB: Cat I 3. Pain: epidural  FB out, Pitocin just started. Epidural per request. Anticipate labor progression and SVD.  Julianne Handler, CNM 12:41 PM

## 2019-01-24 ENCOUNTER — Encounter (HOSPITAL_COMMUNITY): Payer: Self-pay

## 2019-01-24 LAB — ABO/RH: ABO/RH(D): A POS

## 2019-01-24 MED ORDER — ZOLPIDEM TARTRATE 5 MG PO TABS: 5.0000 mg | ORAL_TABLET | Freq: Every evening | ORAL | Status: DC | PRN

## 2019-01-24 MED ORDER — ONDANSETRON HCL 4 MG/2ML IJ SOLN: 4.0000 mg | INTRAMUSCULAR | Status: DC | PRN

## 2019-01-24 MED ORDER — SENNOSIDES-DOCUSATE SODIUM 8.6-50 MG PO TABS
2.0000 | ORAL_TABLET | ORAL | Status: DC
  Filled 2019-01-24: qty 2

## 2019-01-24 MED ORDER — BENZOCAINE-MENTHOL 20-0.5 % EX AERO: 1.0000 "application " | INHALATION_SPRAY | CUTANEOUS | Status: DC | PRN

## 2019-01-24 MED ORDER — LEVONORGESTREL 19.5 MCG/DAY IU IUD
INTRAUTERINE_SYSTEM | Freq: Once | INTRAUTERINE | Status: DC
  Filled 2019-01-24: qty 1

## 2019-01-24 MED ORDER — ACETAMINOPHEN 325 MG PO TABS
650.0000 mg | ORAL_TABLET | ORAL | Status: DC | PRN
  Administered 2019-01-24 – 2019-01-25 (×4): 650 mg via ORAL
  Filled 2019-01-24 (×6): qty 2

## 2019-01-24 MED ORDER — LURASIDONE HCL 40 MG PO TABS
40.0000 mg | ORAL_TABLET | Freq: Every day | ORAL | Status: DC
  Administered 2019-01-24 – 2019-01-25 (×2): 40 mg via ORAL
  Filled 2019-01-24 (×2): qty 1

## 2019-01-24 MED ORDER — OXYCODONE HCL 5 MG PO TABS: 5.0000 mg | ORAL_TABLET | ORAL | Status: DC | PRN

## 2019-01-24 MED ORDER — TETANUS-DIPHTH-ACELL PERTUSSIS 5-2.5-18.5 LF-MCG/0.5 IM SUSP: 0.5000 mL | Freq: Once | INTRAMUSCULAR | Status: DC

## 2019-01-24 MED ORDER — IBUPROFEN 600 MG PO TABS
600.0000 mg | ORAL_TABLET | Freq: Four times a day (QID) | ORAL | Status: DC
  Administered 2019-01-24: 600 mg via ORAL
  Filled 2019-01-24 (×2): qty 1

## 2019-01-24 MED ORDER — HYDROXYZINE HCL 25 MG PO TABS: 25.0000 mg | ORAL_TABLET | Freq: Three times a day (TID) | ORAL | Status: DC | PRN

## 2019-01-24 MED ORDER — SIMETHICONE 80 MG PO CHEW: 80.0000 mg | CHEWABLE_TABLET | ORAL | Status: DC | PRN

## 2019-01-24 MED ORDER — WITCH HAZEL-GLYCERIN EX PADS: 1.0000 "application " | MEDICATED_PAD | CUTANEOUS | Status: DC | PRN

## 2019-01-24 MED ORDER — DIPHENHYDRAMINE HCL 25 MG PO CAPS: 25.0000 mg | ORAL_CAPSULE | Freq: Four times a day (QID) | ORAL | Status: DC | PRN

## 2019-01-24 MED ORDER — ONDANSETRON HCL 4 MG PO TABS: 4.0000 mg | ORAL_TABLET | ORAL | Status: DC | PRN

## 2019-01-24 MED ORDER — DIBUCAINE (PERIANAL) 1 % EX OINT: 1.0000 "application " | TOPICAL_OINTMENT | CUTANEOUS | Status: DC | PRN

## 2019-01-24 NOTE — Progress Notes (Addendum)
CSW received call from [place] [phone] with [facility] CPS. CSW was advised per Venetia Night that he would need to consult with his supervisor as MOB's case is still open with oldest child as MOB relinquished her rights. Per Venetia Night, depending upon results of infants UDS and what his supervisor says-then a decisions for further needs will be determined CSW awaiting call back from [place] at this time.     At this time there are barriers to infant discharging to [facility] as CSW is awaiting CPS disposition.     [name] Bren Borys, MSW, LCSW-A Women[facility] and [place] at [place] [phone]

## 2019-01-24 NOTE — Anesthesia Postprocedure Evaluation (Signed)
Anesthesia Post Note  Patient: Tina Wood  Procedure(s) Performed: AN AD HOC LABOR EPIDURAL     Patient location during evaluation: Mother Baby Anesthesia Type: Epidural Level of consciousness: awake and alert Pain management: pain level controlled Vital Signs Assessment: post-procedure vital signs reviewed and stable Respiratory status: spontaneous breathing, nonlabored ventilation and respiratory function stable Cardiovascular status: stable Postop Assessment: no headache, no backache and epidural receding Anesthetic complications: no    Last Vitals:  Vitals:   01/24/19 0145 01/24/19 0445  BP: (!) 106/54 132/78  Pulse: 71 78  Resp: 18 18  Temp: (!) 36.4 C 36.8 C  SpO2: 99% 99%    Last Pain:  Vitals:   01/24/19 0715  TempSrc:   PainSc: Asleep   Pain Goal:                   [employer]

## 2019-01-24 NOTE — Progress Notes (Signed)
CSW spoke with MOB at bedside to complete assessment. CSW advised MOB that CSW would be calling CPS as older daughter [name] is no longer in her care. Per MOB older daughter was placed with MOB's dad [name] in [place] and [place] was involved in this. Per MOB oldest daughter is in the process of being adopted by MOB's dad and step mom. CSW spoke with Liz [place] with [facility] [facility] and was advised that some one would call CSW back.       [name] Johnnisha Forton, MSW, LCSW-A Women's and [employer] at [facility]  [phone]

## 2019-01-24 NOTE — Progress Notes (Addendum)
CSW spoke with Zay from [place] and was advised that there are no barriers to discharge (infant able to discharge to [facility]).    Per Zay MOB does have open case but MOB has relinquished rights therefore [facility] no longer involved with MOB at this time  At this time CSW will continue to follow CDS and UDS and make report if warranted to [facility].       [name] Adrea Sherpa, MSW, LCSW-A Women[facility] and [place] at [place] [phone]

## 2019-01-24 NOTE — Progress Notes (Signed)
CSW received call back from [place] with [facility] CPS. CSW advised Ms. Leighton Ruff of the situation at hand and was advised that she would have someone else call CSW back. CSW awaiting call at this time.     [name] Lekeisha Arenas, MSW, LCSW-A Women[facility] and [place] at [place] [phone]

## 2019-01-24 NOTE — Progress Notes (Signed)
POSTPARTUM PROGRESS NOTE  Post Partum Day 1  Subjective:  Tina Wood is a 28 y.o. H2D9242 s/p SVD at [redacted]w[redacted]d.  No acute events overnight.  Pt denies problems with ambulating, voiding or po intake.  She denies nausea or vomiting.  Pain is well controlled.  She has had flatus. Lochia Small.   Objective: Blood pressure (!) 88/57, pulse 78, temperature 98.2 F (36.8 C), temperature source Oral, resp. rate 18, height 5\' 7"  (1.702 m), weight 133.1 kg, last menstrual period 04/24/2018, SpO2 99 %, unknown if currently breastfeeding.  Physical Exam:  General: alert, cooperative and no distress Chest: no respiratory distress Heart:regular rate, distal pulses intact Abdomen: soft, nontender,  Uterine Fundus: firm, appropriately tender DVT Evaluation: No calf swelling or tenderness Extremities: No edema Skin: warm, dry  Recent Labs    01/23/19 0651  HGB 11.8*  HCT 37.1    Assessment/Plan: Tina Wood is a 28 y.o. A8T4196 s/p SVD at [redacted]w[redacted]d   PPD#1 - Doing well Contraception: In hospital IUD requested by patient  Feeding: Bottle  Dispo: Plan for discharge tomorrow. # Hx of Severe recurrent major depression without psychotic features [facility]): patient request medication restarted. Lutuda 40 mg daily, and Vistaril 25 mg PRN for anxiety    LOS: 1 day     Tina Wood I, NP 01/24/2019, 9:10 AM
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
## 2019-01-24 NOTE — Progress Notes (Addendum)
CSW notified by Nicola Girt with [facility] CPS that she would send report over to her supervisor. Per Ria Comment, there are barriers tobdischarging infant home with MOB once medically stable.   CSW also made [place] referral to [facility] for further services in the home for infant.      [name] Parker Sawatzky, MSW, LCSW-A Women's and [place] at [place] [phone]

## 2019-01-24 NOTE — Procedures (Signed)
Post-Placental IUD Insertion Procedure Note  Patient identified, verbal informed consent obtained. Witness Nigel Berthold, CNM present and assisted with procedure.   Vaginal, labial and perineal areas thoroughly inspected for lacerations, no lacerations noted. Moderate degree of lochia.  Patient placed in lithotomy position.  Speculum inserted and cervix cleaned with betadine solution.  Liletta IUD inserted without difficulty.  Strings cut at introitus. Speculum removed. Patient tolerated procedure well.   Lot # [id number] Expiration Date: January 2024  Patient given post procedure instructions and IUD care card with expiration date.  Patient is asked to keep IUD strings tucked in her vagina until her postpartum follow up visit in 4-6 weeks. Patient advised to abstain from sexual intercourse and pulling on strings before her follow-up visit. Patient verbalized an understanding of the plan of care and agrees.  Lambert Mody. Juleen China, DO OB/GYN Fellow
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
## 2019-01-24 NOTE — Progress Notes (Signed)
CSW went to give MOB update with CPS as requested. CSW advised MOB that CSW still has not heard anything from CPS at this time. MOB reported once CSW left the room she had a mental break down. MOB expressed that she  is really wanting to take infant home and raise her as she wasnt able to raise her other child. CSW offered understanding but advised MOB that it is up to [place] to make a decision on whether infant can discharge to her or not. MOB understanding but still very upset about the idea that infant will not be going home with her. CSW advised MOB to stay calm at best and that CSW would follow back up with [facility] if no new information has been reported to CSW by 2pm. MOB understanding and expressed that holding infant calms her down.       Tina Wood, MSW, LCSW-A Women's and [place] at [place] [phone]

## 2019-01-25 MED ORDER — PNEUMOCOCCAL VAC POLYVALENT 25 MCG/0.5ML IJ INJ
0.5000 mL | INJECTION | INTRAMUSCULAR | Status: AC
  Administered 2019-01-25: 0.5 mL via INTRAMUSCULAR
  Filled 2019-01-25: qty 0.5

## 2019-01-25 MED ORDER — LEVONORGESTREL 19.5 MCG/DAY IU IUD: INTRAUTERINE_SYSTEM | Freq: Once | INTRAUTERINE | 0 refills | Status: DC

## 2019-01-25 MED ORDER — IBUPROFEN 600 MG PO TABS: 600.0000 mg | ORAL_TABLET | Freq: Four times a day (QID) | ORAL | 0 refills | Status: DC

## 2019-01-25 MED ORDER — HYDROXYZINE HCL 25 MG PO TABS: 25.0000 mg | ORAL_TABLET | Freq: Three times a day (TID) | ORAL | 0 refills | Status: DC | PRN

## 2019-01-25 NOTE — Discharge Instructions (Signed)
Vaginal Delivery, Care After °Refer to this sheet in the next few weeks. These instructions provide you with information about caring for yourself after vaginal delivery. Your health care provider may also give you more specific instructions. Your treatment has been planned according to current medical practices, but problems sometimes occur. Call your health care provider if you have any problems or questions. °What can I expect after the procedure? °After vaginal delivery, it is common to have: °· Some bleeding from your vagina. °· Soreness in your abdomen, your vagina, and the area of skin between your vaginal opening and your anus (perineum). °· Pelvic cramps. °· Fatigue. °Follow these instructions at home: °Medicines °· Take over-the-counter and prescription medicines only as told by your health care provider. °· If you were prescribed an antibiotic medicine, take it as told by your health care provider. Do not stop taking the antibiotic until it is finished. °Driving ° °· Do not drive or operate heavy machinery while taking prescription pain medicine. °· Do not drive for 24 hours if you received a sedative. °Lifestyle °· Do not drink alcohol. This is especially important if you are breastfeeding or taking medicine to relieve pain. °· Do not use tobacco products, including cigarettes, chewing tobacco, or e-cigarettes. If you need help quitting, ask your health care provider. °Eating and drinking °· Drink at least 8 eight-ounce glasses of water every day unless you are told not to by your health care provider. If you choose to breastfeed your baby, you may need to drink more water than this. °· Eat high-fiber foods every day. These foods may help prevent or relieve constipation. High-fiber foods include: °? Whole grain cereals and breads. °? Brown rice. °? Beans. °? Fresh fruits and vegetables. °Activity °· Return to your normal activities as told by your health care provider. Ask your health care provider what  activities are safe for you. °· Rest as much as possible. Try to rest or take a nap when your baby is sleeping. °· Do not lift anything that is heavier than your baby or 10 lb (4.5 kg) until your health care provider says that it is safe. °· Talk with your health care provider about when you can engage in sexual activity. This may depend on your: °? Risk of infection. °? Rate of healing. °? Comfort and desire to engage in sexual activity. °Vaginal Care °· If you have an episiotomy or a vaginal tear, check the area every day for signs of infection. Check for: °? More redness, swelling, or pain. °? More fluid or blood. °? Warmth. °? Pus or a bad smell. °· Do not use tampons or douches until your health care provider says this is safe. °· Watch for any blood clots that may pass from your vagina. These may look like clumps of dark red, brown, or black discharge. °General instructions °· Keep your perineum clean and dry as told by your health care provider. °· Wear loose, comfortable clothing. °· Wipe from front to back when you use the toilet. °· Ask your health care provider if you can shower or take a bath. If you had an episiotomy or a perineal tear during labor and delivery, your health care provider may tell you not to take baths for a certain length of time. °· Wear a bra that supports your breasts and fits you well. °· If possible, have someone help you with household activities and help care for your baby for at least a few days after you   leave the hospital. °· Keep all follow-up visits for you and your baby as told by your health care provider. This is important. °Contact a health care provider if: °· You have: °? Vaginal discharge that has a bad smell. °? Difficulty urinating. °? Pain when urinating. °? A sudden increase or decrease in the frequency of your bowel movements. °? More redness, swelling, or pain around your episiotomy or vaginal tear. °? More fluid or blood coming from your episiotomy or vaginal  tear. °? Pus or a bad smell coming from your episiotomy or vaginal tear. °? A fever. °? A rash. °? Little or no interest in activities you used to enjoy. °? Questions about caring for yourself or your baby. °· Your episiotomy or vaginal tear feels warm to the touch. °· Your episiotomy or vaginal tear is separating or does not appear to be healing. °· Your breasts are painful, hard, or turn red. °· You feel unusually sad or worried. °· You feel nauseous or you vomit. °· You pass large blood clots from your vagina. If you pass a blood clot from your vagina, save it to show to your health care provider. Do not flush blood clots down the toilet without having your health care provider look at them. °· You urinate more than usual. °· You are dizzy or light-headed. °· You have not breastfed at all and you have not had a menstrual period for 12 weeks after delivery. °· You have stopped breastfeeding and you have not had a menstrual period for 12 weeks after you stopped breastfeeding. °Get help right away if: °· You have: °? Pain that does not go away or does not get better with medicine. °? Chest pain. °? Difficulty breathing. °? Blurred vision or spots in your vision. °? Thoughts about hurting yourself or your baby. °· You develop pain in your abdomen or in one of your legs. °· You develop a severe headache. °· You faint. °· You bleed from your vagina so much that you fill two sanitary pads in one hour. °This information is not intended to replace advice given to you by your health care provider. Make sure you discuss any questions you have with your health care provider. °Document Released: 08/12/2000 Document Revised: 01/27/2016 Document Reviewed: 08/30/2015 °Elsevier Interactive Patient Education © 2019 Elsevier Inc. ° °

## 2019-01-25 NOTE — Progress Notes (Addendum)
CSW aware CPS report was made yesterday, 5/29, with [facility] CPS. CSW spoke to [place] with [facility] CPS regarding status of report. Per Jaclyn Shaggy, CPS report has been screened out at this time and there are no barriers to infant discharging to MOB. CSW will continue to monitor results of CDS and make additional CPS report, if warranted.  Ollen Barges, [place]  Women's and [employer] [phone]

## 2019-01-27 LAB — HCV INTERPRETATION

## 2019-01-27 LAB — HCV AB W REFLEX TO QUANT PCR: HCV Ab: <0.1 s/co ratio (ref 0.0–0.9)

## 2019-01-28 ENCOUNTER — Telehealth: Payer: Self-pay

## 2019-01-28 DIAGNOSIS — F319 Bipolar disorder, unspecified: Secondary | ICD-10-CM

## 2019-01-28 MED ORDER — LURASIDONE HCL 40 MG PO TABS: 40.0000 mg | ORAL_TABLET | Freq: Every day | ORAL | 1 refills | Status: DC

## 2019-01-28 NOTE — Telephone Encounter (Signed)
Pt called needing refill of Latuda. Rx with one refill sent per Dr.Leggett

## 2019-01-30 ENCOUNTER — Telehealth: Payer: Self-pay | Admitting: *Deleted

## 2019-01-30 NOTE — Telephone Encounter (Signed)
Pt called stating that she had her IUD put in while still in hospital after her delivery last week.  Today it fell out.  She is wanted to wait until her PP appt to get another one.  I told her that was fine but she must not have intercourse before her appt.  She states that she isn't going to because it is too sore.  She basically just wanted to let us know that it did fall out.

## 2019-02-04 NOTE — BH Specialist Note (Signed)
Pt did not arrive at [facility] [place] video visit, and did not answer the phone. Left MyChart message and Left HIPPA-compliant message to call back Roselyn Reef from Center for [employer] at [phone].    Integrated Behavioral Health Follow Up Visit  MRN: [id number] Name: Tina Wood [facility]

## 2019-02-07 ENCOUNTER — Other Ambulatory Visit: Payer: Self-pay

## 2019-02-07 ENCOUNTER — Ambulatory Visit: Payer: Medicaid Other | Admitting: Clinical

## 2019-02-08 ENCOUNTER — Emergency Department (HOSPITAL_COMMUNITY)
Admission: EM | Admit: 2019-02-08 | Discharge: 2019-02-08 | Disposition: A | Discharge status: Other Acute Inpatient Hospital | Payer: Medicaid Other | Attending: Emergency Medicine | Admitting: Emergency Medicine

## 2019-02-08 ENCOUNTER — Encounter (HOSPITAL_COMMUNITY): Payer: Self-pay

## 2019-02-08 ENCOUNTER — Other Ambulatory Visit: Payer: Self-pay

## 2019-02-08 ENCOUNTER — Inpatient Hospital Stay (HOSPITAL_COMMUNITY)
Admission: AD | Admit: 2019-02-08 | Discharge: 2019-02-13 | DRG: 776 | Disposition: A | Discharge status: Home / Self Care | Payer: No Typology Code available for payment source | Source: Intra-hospital | Attending: Psychiatry | Admitting: Psychiatry

## 2019-02-08 DIAGNOSIS — Z62810 Personal history of physical and sexual abuse in childhood: Secondary | ICD-10-CM | POA: Diagnosis present

## 2019-02-08 DIAGNOSIS — F17210 Nicotine dependence, cigarettes, uncomplicated: Secondary | ICD-10-CM | POA: Diagnosis present

## 2019-02-08 DIAGNOSIS — Z975 Presence of (intrauterine) contraceptive device: Secondary | ICD-10-CM

## 2019-02-08 DIAGNOSIS — F4000 Agoraphobia, unspecified: Secondary | ICD-10-CM | POA: Diagnosis present

## 2019-02-08 DIAGNOSIS — R45851 Suicidal ideations: Secondary | ICD-10-CM | POA: Diagnosis present

## 2019-02-08 DIAGNOSIS — F53 Postpartum depression: Secondary | ICD-10-CM | POA: Insufficient documentation

## 2019-02-08 DIAGNOSIS — Z91040 Latex allergy status: Secondary | ICD-10-CM

## 2019-02-08 DIAGNOSIS — F3130 Bipolar disorder, current episode depressed, mild or moderate severity, unspecified: Secondary | ICD-10-CM | POA: Diagnosis present

## 2019-02-08 DIAGNOSIS — Z560 Unemployment, unspecified: Secondary | ICD-10-CM | POA: Diagnosis not present

## 2019-02-08 DIAGNOSIS — Z915 Personal history of self-harm: Secondary | ICD-10-CM | POA: Insufficient documentation

## 2019-02-08 DIAGNOSIS — Z91013 Allergy to seafood: Secondary | ICD-10-CM | POA: Diagnosis not present

## 2019-02-08 DIAGNOSIS — F419 Anxiety disorder, unspecified: Secondary | ICD-10-CM | POA: Diagnosis present

## 2019-02-08 DIAGNOSIS — Z91048 Other nonmedicinal substance allergy status: Secondary | ICD-10-CM | POA: Diagnosis not present

## 2019-02-08 DIAGNOSIS — F319 Bipolar disorder, unspecified: Secondary | ICD-10-CM

## 2019-02-08 DIAGNOSIS — G43909 Migraine, unspecified, not intractable, without status migrainosus: Secondary | ICD-10-CM | POA: Diagnosis not present

## 2019-02-08 DIAGNOSIS — Z590 Homelessness: Secondary | ICD-10-CM | POA: Diagnosis not present

## 2019-02-08 DIAGNOSIS — Z91018 Allergy to other foods: Secondary | ICD-10-CM | POA: Diagnosis not present

## 2019-02-08 DIAGNOSIS — J45909 Unspecified asthma, uncomplicated: Secondary | ICD-10-CM | POA: Diagnosis present

## 2019-02-08 DIAGNOSIS — Z03818 Encounter for observation for suspected exposure to other biological agents ruled out: Secondary | ICD-10-CM | POA: Diagnosis not present

## 2019-02-08 DIAGNOSIS — Z79899 Other long term (current) drug therapy: Secondary | ICD-10-CM | POA: Diagnosis not present

## 2019-02-08 DIAGNOSIS — O99345 Other mental disorders complicating the puerperium: Secondary | ICD-10-CM | POA: Insufficient documentation

## 2019-02-08 DIAGNOSIS — Z9119 Patient's noncompliance with other medical treatment and regimen: Secondary | ICD-10-CM | POA: Insufficient documentation

## 2019-02-08 DIAGNOSIS — G4700 Insomnia, unspecified: Secondary | ICD-10-CM | POA: Diagnosis present

## 2019-02-08 DIAGNOSIS — Z046 Encounter for general psychiatric examination, requested by authority: Secondary | ICD-10-CM

## 2019-02-08 DIAGNOSIS — F603 Borderline personality disorder: Secondary | ICD-10-CM | POA: Diagnosis present

## 2019-02-08 DIAGNOSIS — F4310 Post-traumatic stress disorder, unspecified: Secondary | ICD-10-CM | POA: Diagnosis present

## 2019-02-08 DIAGNOSIS — Z86011 Personal history of benign neoplasm of the brain: Secondary | ICD-10-CM | POA: Diagnosis not present

## 2019-02-08 DIAGNOSIS — Z91030 Bee allergy status: Secondary | ICD-10-CM

## 2019-02-08 LAB — COMPREHENSIVE METABOLIC PANEL
ALT: 27 U/L (ref 0–44)
AST: 17 U/L (ref 15–41)
Albumin: 3.4 g/dL — ABNORMAL LOW (ref 3.5–5.0)
Alkaline Phosphatase: 95 U/L (ref 38–126)
Anion gap: 8 (ref 5–15)
BUN: 10 mg/dL (ref 6–20)
CO2: 21 mmol/L — ABNORMAL LOW (ref 22–32)
Calcium: 8.9 mg/dL (ref 8.9–10.3)
Chloride: 109 mmol/L (ref 98–111)
Creatinine, Ser: 0.64 mg/dL (ref 0.44–1.00)
GFR calc Af Amer: >60 mL/min (ref >60)
GFR calc non Af Amer: >60 mL/min (ref >60)
Glucose, Bld: 92 mg/dL (ref 70–99)
Potassium: 3.5 mmol/L (ref 3.5–5.1)
Sodium: 138 mmol/L (ref 135–145)
Total Bilirubin: 0.3 mg/dL (ref 0.3–1.2)
Total Protein: 6.7 g/dL (ref 6.5–8.1)

## 2019-02-08 LAB — CBC WITH DIFFERENTIAL/PLATELET
Abs Immature Granulocytes: 0.03 10*3/uL (ref 0.00–0.07)
Basophils Absolute: 0.1 10*3/uL (ref 0.0–0.1)
Basophils Relative: 1 %
Eosinophils Absolute: 0 10*3/uL (ref 0.0–0.5)
Eosinophils Relative: 0 %
HCT: 39.7 % (ref 36.0–46.0)
Hemoglobin: 12.5 g/dL (ref 12.0–15.0)
Immature Granulocytes: 0 %
Lymphocytes Relative: 36 %
Lymphs Abs: 3.6 10*3/uL (ref 0.7–4.0)
MCH: 25.2 pg — ABNORMAL LOW (ref 26.0–34.0)
MCHC: 31.5 g/dL (ref 30.0–36.0)
MCV: 79.9 fL — ABNORMAL LOW (ref 80.0–100.0)
Monocytes Absolute: 0.4 10*3/uL (ref 0.1–1.0)
Monocytes Relative: 4 %
Neutro Abs: 6 10*3/uL (ref 1.7–7.7)
Neutrophils Relative %: 59 %
Platelets: 333 10*3/uL (ref 150–400)
RBC: 4.97 MIL/uL (ref 3.87–5.11)
RDW: 13.8 % (ref 11.5–15.5)
WBC: 10.1 10*3/uL (ref 4.0–10.5)
nRBC: 0 % (ref 0.0–0.2)

## 2019-02-08 LAB — SALICYLATE LEVEL: Salicylate Lvl: <7 mg/dL (ref 2.8–30.0)

## 2019-02-08 LAB — ACETAMINOPHEN LEVEL: Acetaminophen (Tylenol), Serum: <10 ug/mL — ABNORMAL LOW (ref 10–30)

## 2019-02-08 LAB — I-STAT BETA HCG BLOOD, ED (MC, WL, AP ONLY): I-stat hCG, quantitative: <5 m[IU]/mL (ref <5)

## 2019-02-08 LAB — ETHANOL: Alcohol, Ethyl (B): <10 mg/dL (ref <10)

## 2019-02-08 LAB — SARS CORONAVIRUS 2 BY RT PCR (HOSPITAL ORDER, PERFORMED IN ~~LOC~~ HOSPITAL LAB): SARS Coronavirus 2: NEGATIVE

## 2019-02-08 MED ORDER — IBUPROFEN 400 MG PO TABS: 600.0000 mg | ORAL_TABLET | Freq: Four times a day (QID) | ORAL | Status: DC | PRN

## 2019-02-08 MED ORDER — LURASIDONE HCL 40 MG PO TABS
40.0000 mg | ORAL_TABLET | Freq: Every day | ORAL | Status: DC
  Administered 2019-02-08: 40 mg via ORAL
  Filled 2019-02-08: qty 1

## 2019-02-08 MED ORDER — TRAZODONE HCL 50 MG PO TABS
50.0000 mg | ORAL_TABLET | Freq: Every evening | ORAL | Status: DC | PRN
  Administered 2019-02-08 – 2019-02-12 (×6): 50 mg via ORAL
  Filled 2019-02-08 (×5): qty 1
  Filled 2019-02-08: qty 7
  Filled 2019-02-08: qty 1

## 2019-02-08 MED ORDER — HYDROXYZINE HCL 25 MG PO TABS
25.0000 mg | ORAL_TABLET | Freq: Three times a day (TID) | ORAL | Status: DC | PRN
  Administered 2019-02-10 – 2019-02-13 (×6): 25 mg via ORAL
  Filled 2019-02-08 (×4): qty 1
  Filled 2019-02-08: qty 10
  Filled 2019-02-08 (×3): qty 1

## 2019-02-08 MED ORDER — LURASIDONE HCL 40 MG PO TABS
40.0000 mg | ORAL_TABLET | Freq: Every day | ORAL | Status: DC
  Administered 2019-02-09 – 2019-02-12 (×4): 40 mg via ORAL
  Filled 2019-02-08 (×6): qty 1

## 2019-02-08 MED ORDER — ALUM & MAG HYDROXIDE-SIMETH 200-200-20 MG/5ML PO SUSP: 30.0000 mL | ORAL | Status: DC | PRN

## 2019-02-08 MED ORDER — IBUPROFEN 600 MG PO TABS
600.0000 mg | ORAL_TABLET | Freq: Four times a day (QID) | ORAL | Status: DC | PRN
  Administered 2019-02-11: 600 mg via ORAL
  Filled 2019-02-08: qty 1

## 2019-02-08 MED ORDER — HYDROXYZINE HCL 25 MG PO TABS: 25.0000 mg | ORAL_TABLET | Freq: Three times a day (TID) | ORAL | Status: DC | PRN

## 2019-02-08 MED ORDER — MAGNESIUM HYDROXIDE 400 MG/5ML PO SUSP: 30.0000 mL | Freq: Every day | ORAL | Status: DC | PRN
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
## 2019-02-08 NOTE — ED Notes (Signed)
Pt.refused blood work nurse was in the room when refused

## 2019-02-08 NOTE — Progress Notes (Signed)
Pt accepted to [facility]; room 403-1 Marvia Pickles, NP is the accepting provider.   Dr. Parke Poisson is the attending provider.   Call report to [phone]   [facility] ED notified.    Pt is involuntary and will be transported by law enforcement.  Pt is scheduled to arrive at [facility] at [place], [place], [place] Disposition CSW [facility]/TTS [phone]

## 2019-02-08 NOTE — ED Notes (Signed)
Belongings have been placed in locker #1
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
## 2019-02-08 NOTE — ED Notes (Signed)
DSS worker left contact   Tonna Corner [phone]  [phone]

## 2019-02-08 NOTE — ED Notes (Signed)
Pt is very irritable and difficult to assess.  She refused all food because she states "I dont eat that stuff"  She refuses to take Latuda on an empty stomach so we gave her a [place] sandwich.

## 2019-02-08 NOTE — ED Notes (Signed)
Pt refusing blood draws at this time. Pt unwilling to answer triage questions.

## 2019-02-08 NOTE — ED Provider Notes (Signed)
Pt awaiting placement.  Pt resting this am.   Vitals normal    Fransico Meadow, [place] 02/08/19 [place], MD 02/09/19 [id number]

## 2019-02-08 NOTE — Tx Team (Signed)
Initial Treatment Plan 02/08/2019 11:06 PM Tina Wood VHO:[id number]    PATIENT STRESSORS: Marital or family conflict Medication change or noncompliance Occupational concerns   PATIENT STRENGTHS: Communication skills Physical Health   PATIENT IDENTIFIED PROBLEMS: Anxiety  Depression  "Get on medications"  " stable life"               DISCHARGE CRITERIA:  Adequate post-discharge living arrangements Improved stabilization in mood, thinking, and/or behavior Medical problems require only outpatient monitoring Motivation to continue treatment in a less acute level of care  PRELIMINARY DISCHARGE PLAN: Attend aftercare/continuing care group Attend PHP/IOP Outpatient therapy Placement in alternative living arrangements  PATIENT/FAMILY INVOLVEMENT: This treatment plan has been presented to and reviewed with the patient, Tina Wood, and/or family member, .  The patient and family have been given the opportunity to ask questions and make suggestions.  Wolfgang Phoenix, RN 02/08/2019, 11:06 PM

## 2019-02-08 NOTE — ED Notes (Signed)
TTS at bedside. 

## 2019-02-08 NOTE — Progress Notes (Signed)
Pt is recommended for inpt tx per Lindon Romp, NP. No appropriate beds at [facility] per [facility]. TTS to seek placement. Pt's nurse Grindstaff, Gareth Morgan, RN and EDP Ward, Delice Bison, DO have been advised.

## 2019-02-08 NOTE — ED Notes (Signed)
Pt moved to purple zone #52 via stretcher. Ambulated from stretcher to bed without difficulty. Denies pain. Calm and cooperative at this time.

## 2019-02-08 NOTE — ED Notes (Signed)
Breakfast tray ordered 
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
## 2019-02-08 NOTE — Progress Notes (Addendum)
CSW met with patient at bedside to complete assessment after receiving notification that patient was IVC'ed and there was an infant involved. CSW entered patient's room and awoke her by stating good morning and she immediately covered her face with her blanket. CSW explained self and role to patient and asked if now would be a good time to talk and the patient stated agreement. CSW told patient that her breakfast was beside the bed and she refused to eat it due to her not "ever eating breakfast." CSW inquired with patient what brought her to the hospital and she stated she had a mental breakdown. Whenever CSW asked patient to elaborate, patient stated that she got broken up with yesterday by her boyfriend Jaxson. Patient reports that her and Francis Dowse had been staying at the [facility] [address] since Monday night with the newborn Daisie. Patient reports that prior to staying at the [facility] that she was in [place], [place] with her "sister" who is just a friend. Patient reports that for the last two weeks she has not wanted to eat, sleep, care for herself or the newborn. Patient reports that her boyfriend Francis Dowse is not the FOB and that the FOB is not involved. Patient reports that she has a mental health history that consists of Borderline personality disorder, bipolar, depression, anxiety, schizoaffective, oppositional defiance disorder, and ADHD. Patient denies being on any medications prior to arrival. Patient denies any thoughts or feelings of suicide or homicide. Patient reports that she does not have a car so she asks strangers for rides to various places. Patient denies any safety concerns with that method of transportation. Patient denies having any safe location to return to at time of discharge. Patient states she cannot return to grandparent's house in [place]. Patient stated "I know CPS is involved now so I will never see my daughter again." CSW did inform patient that a CPS report would be made due to the  incident that occurred and she did not respond. CSW inquired with patient regarding the statement that was given to [employer] from her grandfather's IVC petition that stated that she said "If something were to happen to the baby it would be my fault." Patient confirms sending that text to her boyfriend but stated she was referencing the unsafe situation that Chyrel Masson would be in if she went to stay with her grandparents. CSW asked patient if the infant was in an unsafe environment by being with her grandparents and she stated no. Patient reports that Chyrel Masson is eating formula and is feeding well. Patient reports that she receives [facility], [employer], and Medicaid. Patient reports that her child has established care with a pediatrician. Patient reports that she has all the items needed for newborn care including a safe place for sleeping and a car seat. Patient reports that she is unemployed but receives SSI income monthly. Patient reports that this is her second child, the oldest is 78 years old named Dixie who lives in [place] with her fatherCSW inquired with patient if she had any specific needs that CSW could assist her with and she stated "if you can get me out of here and get me my 66 mg's of Latuda that would be a start."   CSW spoke Betha Loa, the patient's grandfather to inquire about the whereabouts of the newborn. Gustavus Messing reports that the infant is in the care of him and his wife. CPS was present in the Robinson's home at the time of the call. CSW spoke with  [place] CPS to discuss current situation. Jodi Mourning reports that the infant will remain in the care of her great-grandparents until further notice. CPS reports that the infant has a pediatrician appointment today at [place] advised CPS that the patient was recommended for inpatient psych treatment. CPS reports that [place] would be coming to visit with patient at some point today.   CSW spoke with patient's RN Nena Jordan to  communicate the received information. CSW to continue following to assist with a safe and appropriate discharge.  Madilyn Fireman, MSW, LCSW-A Clinical Social Worker [facility] [phone]

## 2019-02-09 DIAGNOSIS — F3130 Bipolar disorder, current episode depressed, mild or moderate severity, unspecified: Secondary | ICD-10-CM

## 2019-02-09 MED ORDER — LORAZEPAM 1 MG PO TABS
1.0000 mg | ORAL_TABLET | Freq: Once | ORAL | Status: AC
  Administered 2019-02-09: 1 mg via ORAL
  Filled 2019-02-09: qty 1

## 2019-02-09 MED ORDER — HALOPERIDOL 5 MG PO TABS
5.0000 mg | ORAL_TABLET | Freq: Once | ORAL | Status: AC
  Administered 2019-02-09: 5 mg via ORAL
  Filled 2019-02-09 (×2): qty 1
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
## 2019-02-09 NOTE — Progress Notes (Signed)
D. Pt presents with an anxious affect/ depressed mood- somewhat irritable behavior- pt isolative to room for much of the day- refused to go into dayroom, reporting that she felt  judged by other patients. Pt currently denies SI/HI and AVH A. Labs and vitals monitored. Pt compliant with medications. Pt supported emotionally and encouraged to express concerns and ask questions.   R. Pt remains safe with 15 minute checks. Will continue POC.

## 2019-02-09 NOTE — BHH Group Notes (Signed)
[facility] Group Notes:  (Nursing)  Date:  02/09/2019  Time:130 PM Type of Therapy:  Nurse Education  Participation Level:  Did Not Attend   Waymond Cera 02/09/2019, 3:59 PM

## 2019-02-09 NOTE — Progress Notes (Signed)
EKG results placed on the outside of shadow chart
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
## 2019-02-09 NOTE — BHH Group Notes (Signed)
[facility] Group Notes: (Clinical Social Work)   02/09/2019      Type of Therapy:  Group Therapy   Participation Level:  Did Not Attend - was invited both individually by MHT and by overhead announcement, chose not to attend.   Selmer Dominion, LCSW 02/09/2019, 12:30 PM

## 2019-02-09 NOTE — Progress Notes (Signed)
[place] NOVEL CORONAVIRUS (COVID-19) DAILY CHECK-OFF SYMPTOMS - answer yes or no to each - every day NO YES  Have you had a fever in the past 24 hours?  . Fever (Temp > 37.80C / 100F) X   Have you had any of these symptoms in the past 24 hours? . New Cough .  Sore Throat  .  Shortness of Breath .  Difficulty Breathing .  Unexplained Body Aches   X   Have you had any one of these symptoms in the past 24 hours not related to allergies?   . Runny Nose .  Nasal Congestion .  Sneezing   X   If you have had runny nose, nasal congestion, sneezing in the past 24 hours, has it worsened?  X   EXPOSURES - check yes or no X   Have you traveled outside the state in the past 14 days?  X   Have you been in contact with someone with a confirmed diagnosis of COVID-19 or PUI in the past 14 days without wearing appropriate PPE?  X   Have you been living in the same home as a person with confirmed diagnosis of COVID-19 or a PUI (household contact)?    X   Have you been diagnosed with COVID-19?    X              What to do next: Answered NO to all: Answered YES to anything:   Proceed with unit schedule Follow the [facility] Inpatient Flowsheet.   

## 2019-02-10 LAB — HEMOGLOBIN A1C
Hgb A1c MFr Bld: 5.3 % (ref 4.8–5.6)
Mean Plasma Glucose: 105.41 mg/dL

## 2019-02-10 LAB — LIPID PANEL
Cholesterol: 164 mg/dL (ref 0–200)
HDL: 38 mg/dL — ABNORMAL LOW (ref >40)
LDL Cholesterol: 102 mg/dL — ABNORMAL HIGH (ref 0–99)
Total CHOL/HDL Ratio: 4.3 RATIO
Triglycerides: 122 mg/dL (ref <150)
VLDL: 24 mg/dL (ref 0–40)

## 2019-02-10 LAB — TSH: TSH: 0.696 u[IU]/mL (ref 0.350–4.500)

## 2019-02-10 NOTE — Plan of Care (Addendum)
D: Pt alert and oriented on the unit.  Pt denies SI/HI, A/VH. Pt's affect was flat with depressed mood. Pt yelled on the telephone while crying and cursing. Pt was irritable for most of the day and paced the hallway and isolated in her room. Pt did not attend groups but did go outside during unit recreational activity.   A: Education, support and encouragement provided, q15 minute safety checks remain in effect. Medications administered per MD orders.  R: No reactions/side effects to medicine noted. Pt denies any concerns at this time, and verbally contracts for safety. Pt ambulating on the unit with no issues. Pt remains safe on and off the unit.   Problem: Medication: Goal: Compliance with prescribed medication regimen will improve Outcome: Progressing

## 2019-02-10 NOTE — Progress Notes (Signed)
[facility] MD Progress Note  02/10/2019 3:22 PM Tina Wood  MRN:  [id number]   Subjective: Patient reports that she is very upset because she is not leaving today.  She states that now she will be homeless when she leaves.  She states that her boyfriend was getting a new worker to help coordinate his place to stay and she had until today to be approved.  She states that now she will take her 2 to 3 months.  Patient states that she denies making any suicidal comments upon arrival to the hospital and denies wanting to harm her baby.  Patient reports that she does not want to talk to them any longer because the more I talk to her the more pissed off she becomes.  Patient refuses to answer questions about her sleep and her appetite.  Patient did state in the beginning of the interview that she only came in to restart her medications and expected to be discharged from the hospital and did not want to be admitted.  Objective: Patient's chart and findings reviewed and discussed with treatment team.  Patient presents in her bed awake and alert and oriented.  Patient is very irritated and yelling loudly.  Patient was given multiple opportunities to discuss the issues that she is having and she refuses to.  It is indicated that patient was brought in under IVC because patient has not been taking her medications and she had texted her boyfriend that if anything happens to the baby will be his fault.  TTS staff evaluated patient and she was endorsing SI without a plan on 02/08/2019.  Patient is becoming uncooperative and irritated due to not being discharged today as she is requested.  I do agree with the physician that the patient is at a high risk especially with a 44-week-old baby in the house.  Principal Problem: Bipolar I disorder, most recent episode depressed ([place]) Diagnosis: Principal Problem:   Bipolar I disorder, most recent episode depressed ([place])  Total Time spent with patient: 20 minutes  Past  Psychiatric History: See H&P  Past Medical History:  Past Medical History:  Diagnosis Date  . Asthma   . Brain tumor (benign) ([place])   . Seizures ([place])     Past Surgical History:  Procedure Laterality Date  . SKIN GRAFT    . WRIST SURGERY     Family History:  Family History  Family history unknown: Yes   Family Psychiatric  History: See H&P Social History:  Social History   Substance and Sexual Activity  Alcohol Use No     Social History   Substance and Sexual Activity  Drug Use No    Social History   Socioeconomic History  . Marital status: Single    Spouse name: Not on file  . Number of children: Not on file  . Years of education: Not on file  . Highest education level: Not on file  Occupational History  . Not on file  Social Needs  . Financial resource strain: Somewhat hard  . Food insecurity    Worry: Sometimes true    Inability: Sometimes true  . Transportation needs    Medical: No    Non-medical: Not on file  Tobacco Use  . Smoking status: Current Every Day Smoker    Packs/day: 1.00    Years: 9.00    Pack years: 9.00    Types: Cigarettes  . Smokeless tobacco: Former Systems developer    Types: Snuff  Substance and Sexual Activity  .  Alcohol use: No  . Drug use: No  . Sexual activity: Yes    Birth control/protection: None  Lifestyle  . Physical activity    Days per week: Not on file    Minutes per session: Not on file  . Stress: Very much  Relationships  . Social Herbalist on phone: Not on file    Gets together: Not on file    Attends religious service: Not on file    Active member of club or organization: Not on file    Attends meetings of clubs or organizations: Not on file    Relationship status: Not on file  Other Topics Concern  . Not on file  Social History Narrative  . Not on file   Additional Social History:    Pain Medications: See MAR Prescriptions: See MAR Over the Counter: See MAR History of alcohol / drug use?: No  history of alcohol / drug abuse                    Sleep: refused to answer  Appetite:  refused to answer  Current Medications: Current Facility-Administered Medications  Medication Dose Route Frequency Provider Last Rate Last Dose  . alum & mag hydroxide-simeth (MAALOX/MYLANTA) 200-200-20 MG/5ML suspension 30 mL  30 mL Oral Q4H PRN Allisha Harter, Lowry Ram, FNP      . hydrOXYzine (ATARAX/VISTARIL) tablet 25 mg  25 mg Oral TID PRN Lanice Folden, Lowry Ram, FNP      . ibuprofen (ADVIL) tablet 600 mg  600 mg Oral Q6H PRN Yacine Droz, Lowry Ram, FNP      . lurasidone (LATUDA) tablet 40 mg  40 mg Oral Q breakfast Eran Mistry, Darnelle Maffucci B, FNP   40 mg at 02/10/19 1234  . magnesium hydroxide (MILK OF MAGNESIA) suspension 30 mL  30 mL Oral Daily PRN Cleston Lautner, Lowry Ram, FNP      . traZODone (DESYREL) tablet 50 mg  50 mg Oral QHS PRN Kerby Hockley, Lowry Ram, FNP   50 mg at 02/09/19 2129    Lab Results:  Results for orders placed or performed during the hospital encounter of 02/08/19 (from the past 48 hour(s))  Lipid panel     Status: Abnormal   Collection Time: 02/10/19  7:55 AM  Result Value Ref Range   Cholesterol 164 0 - 200 mg/dL   Triglycerides 122 <150 mg/dL   HDL 38 (L) >40 mg/dL   Total CHOL/HDL Ratio 4.3 RATIO   VLDL 24 0 - 40 mg/dL   LDL Cholesterol 102 (H) 0 - 99 mg/dL    Comment:        Total Cholesterol/HDL:CHD Risk Coronary Heart Disease Risk Table                     Men   Women  1/2 Average Risk   3.4   3.3  Average Risk       5.0   4.4  2 X Average Risk   9.6   7.1  3 X Average Risk  23.4   11.0        Use the calculated Patient Ratio above and the CHD Risk Table to determine the patient's CHD Risk.        ATP III CLASSIFICATION (LDL):  <100     mg/dL   Optimal  100-129  mg/dL   Near or Above  Optimal  130-159  mg/dL   Borderline  160-189  mg/dL   High  >190     mg/dL   Very High Performed at [facility] [address], [place], [place] [address]   TSH      Status: None   Collection Time: 02/10/19  7:55 AM  Result Value Ref Range   TSH 0.696 0.350 - 4.500 uIU/mL    Comment: Performed by a 3rd Generation assay with a functional sensitivity of <=0.01 uIU/mL. Performed at [facility], [place] [address] [place], [place] [address]   Hemoglobin A1c     Status: None   Collection Time: 02/10/19  7:55 AM  Result Value Ref Range   Hgb A1c MFr Bld 5.3 4.8 - 5.6 %    Comment: (NOTE) Pre diabetes:          5.7%-6.4% Diabetes:              >6.4% Glycemic control for   <7.0% adults with diabetes    Mean Plasma Glucose 105.41 mg/dL    Comment: Performed at [facility] [address] [place],  [address]    Blood Alcohol level:  Lab Results  Component Value Date   [facility] <10 02/08/2019   ETH <10 74/94/4967    Metabolic Disorder Labs: Lab Results  Component Value Date   HGBA1C 5.3 02/10/2019   MPG 105.41 02/10/2019   MPG 108.28 05/05/2018   Lab Results  Component Value Date   PROLACTIN 74.7 (H) 10/16/2018   PROLACTIN 20.9 05/05/2018   Lab Results  Component Value Date   CHOL 164 02/10/2019   TRIG 122 02/10/2019   HDL 38 (L) 02/10/2019   CHOLHDL 4.3 02/10/2019   VLDL 24 02/10/2019   LDLCALC 102 (H) 02/10/2019   LDLCALC 90 05/05/2018    Physical Findings: AIMS: Facial and Oral Movements Muscles of Facial Expression: None, normal Lips and Perioral Area: None, normal Jaw: None, normal Tongue: None, normal,Extremity Movements Upper (arms, wrists, hands, fingers): None, normal Lower (legs, knees, ankles, toes): None, normal, Trunk Movements Neck, shoulders, hips: None, normal, Overall Severity Severity of abnormal movements (highest score from questions above): None, normal Incapacitation due to abnormal movements: None, normal Patient's awareness of abnormal movements (rate only patient's report): No Awareness, Dental Status Current problems with teeth and/or dentures?: No Does patient usually wear  dentures?: No  CIWA:  CIWA-Ar Total: 7 COWS:  COWS Total Score: 4  Musculoskeletal: Strength & Muscle Tone: within normal limits Gait & Station: normal Patient leans: N/A  Psychiatric Specialty Exam: Physical Exam  Nursing note and vitals reviewed. Constitutional: She is oriented to person, place, and time. She appears well-developed and well-nourished.  Respiratory: Effort normal.  Musculoskeletal: Normal range of motion.  Neurological: She is alert and oriented to person, place, and time.  Skin: Skin is warm.    Review of Systems  Constitutional: Negative.   HENT: Negative.   Eyes: Negative.   Respiratory: Negative.   Cardiovascular: Negative.   Gastrointestinal: Negative.   Genitourinary: Negative.   Musculoskeletal: Negative.   Skin: Negative.   Neurological: Negative.   Endo/Heme/Allergies: Negative.   Psychiatric/Behavioral: Positive for depression.    Blood pressure 91/65, pulse (!) 45, temperature 98 F (36.7 C), temperature source Oral, resp. rate 20, height 5\' 7"  (1.702 m), weight 121.6 kg, SpO2 (!) 78 %, unknown if currently breastfeeding.Body mass index is 41.97 kg/m.  General Appearance: Casual  Eye Contact:  Fair  Speech:  Clear and Coherent  Volume:  Increased  Mood:  Irritable  Affect:  Congruent  Thought Process:  Coherent and Descriptions of Associations: Intact  Orientation:  Full (Time, Place, and Person)  Thought Content:  WDL  Suicidal Thoughts:  No  Homicidal Thoughts:  No  Memory:  Immediate;   Good Recent;   Good Remote;   Good  Judgement:  Fair  Insight:  Fair  Psychomotor Activity:  Normal  Concentration:  Concentration: Good and Attention Span: Good  Recall:  Good  Fund of Knowledge:  Good  Language:  Good  Akathisia:  No  Handed:  Right  AIMS (if indicated):     Assets:  Communication Skills Desire for Improvement Resilience Social Support Transportation  ADL's:  Intact  Cognition:  WNL  Sleep:  Number of Hours: 5.25    Problems addressed Bipolar I  Treatment Plan Summary: Daily contact with patient to assess and evaluate symptoms and progress in treatment, Medication management and Plan is to:  Continue latuda 40 mg PO Daily for mood stability Continue Trazodone 50 mg po qhS PRN for insomnia Continue Vistaril 25 mg PO TID PRN for anxiety Encourage group therapy participation Continue Q15 minute safety checks CSW to assist with disposition  Lowry Ram Arthea Nobel, FNP 02/10/2019, 3:22 PM
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
## 2019-02-10 NOTE — [facility] Group Notes (Signed)
[place] LCSW Group Therapy Note  02/10/2019   10:00-11:00AM  Type of Therapy and Topic:  Group Therapy:  Unhealthy versus Healthy Supports, Which Am I?  Participation Level:  Did Not Attend   Description of Group:  Patients in this group were introduced to the concept that additional supports including self-support are an essential part of recovery.  Initially a discussion was held about the differences between healthy versus unhealthy supports.  Patients were asked to share what unhealthy supports in their lives need to be addressed, as well as what additional healthy supports could be added for greater help in reaching their goals.   A song entitled "My Own Hero" was played and a group discussion ensued in which patients stated they could relate to the song and it inspired them to realize they have be willing to help themselves in order to succeed, because other people cannot achieve sobriety or stability for them.  We discussed adding a variety of healthy supports to address the various needs in patient lives, including becoming more self-supportive.  A song was played called "I Know Where I've Been" toward the end of group and used to conduct an inspirational wrap-up to group of remembering how far they have already come in their journey.  Therapeutic Goals: 1)  Highlight the differences between healthy and unhealthy supports 2)  Suggest the importance of being a part of one's own support system 2)  Discuss reasons people in one's life may eventually be unable to be continually supportive  3)  Identify the patient's current support system   4) Elicit commitments to add healthy supports and to become more conscious of being self-supportive   Summary of Patient Progress:  Did not attend  Therapeutic Modalities:   Motivational Interviewing Activity  Maretta Los , MSW, LCSW

## 2019-02-10 NOTE — [facility] Suicide Risk Assessment (Signed)
[place] INPATIENT:  Family/Significant Other Suicide Prevention Education  Suicide Prevention Education:  Education Completed;  Ex-boyfriend Edwinna Areola [phone],  (name of family member/significant other) has been identified by the patient as the family member/significant other with whom the patient will be residing, and identified as the person(s) who will aid the patient in the event of a mental health crisis (suicidal ideations/suicide attempt).  With written consent from the patient, the family member/significant other has been provided the following suicide prevention education, prior to the and/or following the discharge of the patient.  Ex-boyfriend (who had been identified by patient as boyfriend, but he reports this is not the case) states he has talked to the patient several times a day while she has been here.  The patient sounds like she is still having a lot of anxiety, especially thinking about the baby.  He has told her repeatedly that she can come stay with him, without restrictions.  It is not true that she will be unable to live with him if she does not leave today.  Whenever she gets anxious or angry, she will "jump up and get somebody to pick her up.  She doesn't even think, she just takes off."  He states he knows when her mood changes because she stops talking.  He knows when to ask her if she is thinking about hurting herself.  Pt does not know who the father of the baby is, but ex-boyfriend states it is not him.    The suicide prevention education provided includes the following:  Suicide risk factors  Suicide prevention and interventions  National Suicide Hotline telephone number  [facility] assessment telephone number  [facility] Emergency Assistance [place] and/or Residential Mobile Crisis Unit telephone number  Request made of family/significant other to:  Remove weapons (e.g., guns, rifles, knives), all items previously/currently  identified as safety concern.    Remove drugs/medications (over-the-counter, prescriptions, illicit drugs), all items previously/currently identified as a safety concern.  The family member/significant other verbalizes understanding of the suicide prevention education information provided.  The family member/significant other agrees to remove the items of safety concern listed above.  Berlin Hun Grossman-Orr 02/10/2019, 4:27 PM

## 2019-02-10 NOTE — Progress Notes (Signed)
Patient ID: Tina Wood, female   DOB: 12-11-1990, 28 y.o.   MRN: [id number]  [place] NOVEL CORONAVIRUS (COVID-19) DAILY CHECK-OFF SYMPTOMS - answer yes or no to each - every day NO YES  Have you had a fever in the past 24 hours?  . Fever (Temp > 37.80C / 100F) X   Have you had any of these symptoms in the past 24 hours? . New Cough .  Sore Throat  .  Shortness of Breath .  Difficulty Breathing .  Unexplained Body Aches   X   Have you had any one of these symptoms in the past 24 hours not related to allergies?   . Runny Nose .  Nasal Congestion .  Sneezing   X   If you have had runny nose, nasal congestion, sneezing in the past 24 hours, has it worsened?  X   EXPOSURES - check yes or no X   Have you traveled outside the state in the past 14 days?  X   Have you been in contact with someone with a confirmed diagnosis of COVID-19 or PUI in the past 14 days without wearing appropriate PPE?  X   Have you been living in the same home as a person with confirmed diagnosis of COVID-19 or a PUI (household contact)?    X   Have you been diagnosed with COVID-19?    X              What to do next: Answered NO to all: Answered YES to anything:   Proceed with unit schedule Follow the [facility] Inpatient Flowsheet.

## 2019-02-10 NOTE — Progress Notes (Signed)
D   Pt anxious and upset   She said vistaril does not work for her and she would like to have some ativan and haldol but she didn't want to wait for nurse to call the doctor and just took vistaril and trazadone and went to bed  She is irritable and qanxious and endorses depression A   Verbal support given   medicaations administered and effectiveness monitored   Q 15 min checks R  Pt remains safe at this time  [place] CORONAVIRUS (COVID-19) DAILY CHECK-OFF SYMPTOMS - answer yes or no to each - every day NO YES  Have you had a fever in the past 24 hours?  . Fever (Temp > 37.80C / 100F) X   Have you had any of these symptoms in the past 24 hours? . New Cough .  Sore Throat  .  Shortness of Breath .  Difficulty Breathing .  Unexplained Body Aches   X   Have you had any one of these symptoms in the past 24 hours not related to allergies?   . Runny Nose .  Nasal Congestion .  Sneezing   X   If you have had runny nose, nasal congestion, sneezing in the past 24 hours, has it worsened?  X   EXPOSURES - check yes or no X   Have you traveled outside the state in the past 14 days?  X   Have you been in contact with someone with a confirmed diagnosis of COVID-19 or PUI in the past 14 days without wearing appropriate PPE?  X   Have you been living in the same home as a person with confirmed diagnosis of COVID-19 or a PUI (household contact)?    X   Have you been diagnosed with COVID-19?    X              What to do next: Answered NO to all: Answered YES to anything:   Proceed with unit schedule Follow the [facility] Inpatient Flowsheet.

## 2019-02-10 NOTE — Progress Notes (Signed)
DAR NOTE: Patient presents with anxious and irritable mood. Pt isolative in her at the beginning of the shift. Pt came out during med pass requesting for shampoo and conditioner, pt offered shampoo with conditioner mixed, pt became labile, irritable and verbally aggressive stating she can not use that shampoo and she will just stayed without washing her hair until her hair fall off. Pt demanded the write to tell other pt to get off the phone as she wanted to call her. While on the phone, pt became very loud, shouting and screaming on the phone, pt told to lower her voice or she can use the phone in the dayroom since other pts were already in bed or the phone would be turned off. Pt stated she can not use the phone in the day room because it hurts her ear, pt became more irritable and agitated, went to her room and slammed the door. Pt was offered Vistaril PRN for anxiety, pt declined stating it does not work for her. Pt kept on saying she we staff does not understand to have a baby and so she have the right to behave the way she is behaving. Pt asked for another pillow, when offered pt refused stating she does not use flat pillow. Pt became very verbally abusive towards staff, threating to file a complain. NP on called ordered 5 mg of haldol and 1 mg of Ativan PO which pt declined at first, but then changed her mind and took the medication. Pt later went to her room, been awake most of the time, but stayed in her room. Maintained on routine safety checks.  Medications given as prescribed.  Support and encouragement offered as needed. Will continue to monitor.

## 2019-02-10 NOTE — Progress Notes (Signed)
Pt did not attend wrap-up group   

## 2019-02-11 NOTE — Progress Notes (Signed)
CSW spoke with [place] Social Worker, Armanda Magic [phone]). CPS wants to be updated regarding patient's discharge plans, including but not limited to: patient's living arrangements (per CPS, patient said she would stay with a friend, but she did not know the friend's last name), patient's discharge date, and patient's follow up.  Richard H[place] granted consents for leaving a detailed voicemail if CSW is unable to reach him.  CSW following for updates.  Stephanie Acre, LCSW-A Clinical Social Worker

## 2019-02-11 NOTE — Progress Notes (Addendum)
[facility] MD Progress Note  02/11/2019 12:31 PM Tina Wood  MRN:  [id number]   Subjective: patient reports she is frustrated about being in the hospital but states " I feel a lot better today". She attributes to having been able to have a conversation with Water engineer. States CPS worker reassured her that infant daughter can stay with her extended family until she is feeling stable " and then I get her". Denies medication side effects. Denies suicidal ideations .  Objective: I have discussed case with treatment team and have met with patient. 28 year old female, currently two weeks post partum , presented under IVC reporting that patient had made threat to hurt her child. Patient categorically denies this, and states she has had no thoughts of hurting her child or anyone else, and also denies any SI. She does report history of mental illness, and reports past history of Bipolar Disorder , PTSD, Borderline Personality Disorder diagnosis. States she had done well with Latuda in the past, but had stopped during pregnancy. Is interested in restarting this medication.  Today patient presents with improvement : currently calmer, better related, less irritable. States she is feeling better which she attributes to good phone conversation with CPS worker- see above- and to being back on her psychiatric medications. States she has history of good response to [place]. Today visible on unit, no agitated behaviors, speech not loud today. Denies medication side effects. We reviewed events leading to admission. Patient states that what she meant to say in text that she sent was " not that I was going to hurt my baby, I would never do that", but that she was concerned that if she had an argument with her family  she might have a seizure and that child could fall if she did.  ( reports history of seizure like episodes, at times triggered by arguments with family- has been diagnosed with non epileptic seizures ) .   Has had no seizure like episodes on unit .  Principal Problem: Bipolar I disorder, most recent episode depressed ([place]) Diagnosis: Principal Problem:   Bipolar I disorder, most recent episode depressed ([place])  Total Time spent with patient: 20 minutes  Past Psychiatric History: See H&P  Past Medical History:  Past Medical History:  Diagnosis Date  . Asthma   . Brain tumor (benign) ([place])   . Seizures ([place])     Past Surgical History:  Procedure Laterality Date  . SKIN GRAFT    . WRIST SURGERY     Family History:  Family History  Family history unknown: Yes   Family Psychiatric  History: See H&P Social History:  Social History   Substance and Sexual Activity  Alcohol Use No     Social History   Substance and Sexual Activity  Drug Use No    Social History   Socioeconomic History  . Marital status: Single    Spouse name: Not on file  . Number of children: Not on file  . Years of education: Not on file  . Highest education level: Not on file  Occupational History  . Not on file  Social Needs  . Financial resource strain: Somewhat hard  . Food insecurity    Worry: Sometimes true    Inability: Sometimes true  . Transportation needs    Medical: No    Non-medical: Not on file  Tobacco Use  . Smoking status: Current Every Day Smoker    Packs/day: 1.00    Years: 9.00  Pack years: 9.00    Types: Cigarettes  . Smokeless tobacco: Former Systems developer    Types: Snuff  Substance and Sexual Activity  . Alcohol use: No  . Drug use: No  . Sexual activity: Yes    Birth control/protection: None  Lifestyle  . Physical activity    Days per week: Not on file    Minutes per session: Not on file  . Stress: Very much  Relationships  . Social Herbalist on phone: Not on file    Gets together: Not on file    Attends religious service: Not on file    Active member of club or organization: Not on file    Attends meetings of clubs or organizations: Not on file     Relationship status: Not on file  Other Topics Concern  . Not on file  Social History Narrative  . Not on file   Additional Social History:    Pain Medications: See MAR Prescriptions: See MAR Over the Counter: See MAR History of alcohol / drug use?: No history of alcohol / drug abuse  Sleep: improving   Appetite:  improving  Current Medications: Current Facility-Administered Medications  Medication Dose Route Frequency Provider Last Rate Last Dose  . alum & mag hydroxide-simeth (MAALOX/MYLANTA) 200-200-20 MG/5ML suspension 30 mL  30 mL Oral Q4H PRN Money, Lowry Ram, FNP      . hydrOXYzine (ATARAX/VISTARIL) tablet 25 mg  25 mg Oral TID PRN Money, Lowry Ram, FNP   25 mg at 02/10/19 2211  . ibuprofen (ADVIL) tablet 600 mg  600 mg Oral Q6H PRN Money, Lowry Ram, FNP      . lurasidone (LATUDA) tablet 40 mg  40 mg Oral Q breakfast Money, Lowry Ram, FNP   40 mg at 02/11/19 0803  . magnesium hydroxide (MILK OF MAGNESIA) suspension 30 mL  30 mL Oral Daily PRN Money, Lowry Ram, FNP      . traZODone (DESYREL) tablet 50 mg  50 mg Oral QHS PRN Money, Lowry Ram, FNP   50 mg at 02/10/19 2210    Lab Results:  Results for orders placed or performed during the hospital encounter of 02/08/19 (from the past 48 hour(s))  Lipid panel     Status: Abnormal   Collection Time: 02/10/19  7:55 AM  Result Value Ref Range   Cholesterol 164 0 - 200 mg/dL   Triglycerides 122 <150 mg/dL   HDL 38 (L) >40 mg/dL   Total CHOL/HDL Ratio 4.3 RATIO   VLDL 24 0 - 40 mg/dL   LDL Cholesterol 102 (H) 0 - 99 mg/dL    Comment:        Total Cholesterol/HDL:CHD Risk Coronary Heart Disease Risk Table                     Men   Women  1/2 Average Risk   3.4   3.3  Average Risk       5.0   4.4  2 X Average Risk   9.6   7.1  3 X Average Risk  23.4   11.0        Use the calculated Patient Ratio above and the CHD Risk Table to determine the patient's CHD Risk.        ATP III CLASSIFICATION (LDL):  <100     mg/dL   Optimal   100-129  mg/dL   Near or Above  Optimal  130-159  mg/dL   Borderline  160-189  mg/dL   High  >190     mg/dL   Very High Performed at [facility] [address] [place], [place] [address]   TSH     Status: None   Collection Time: 02/10/19  7:55 AM  Result Value Ref Range   TSH 0.696 0.350 - 4.500 uIU/mL    Comment: Performed by a 3rd Generation assay with a functional sensitivity of <=0.01 uIU/mL. Performed at [facility], [place] [address], [place], [place] [address]   Hemoglobin A1c     Status: None   Collection Time: 02/10/19  7:55 AM  Result Value Ref Range   Hgb A1c MFr Bld 5.3 4.8 - 5.6 %    Comment: (NOTE) Pre diabetes:          5.7%-6.4% Diabetes:              >6.4% Glycemic control for   <7.0% adults with diabetes    Mean Plasma Glucose 105.41 mg/dL    Comment: Performed at [facility] [address] [place], [place] [address]    Blood Alcohol level:  Lab Results  Component Value Date   [facility] <10 02/08/2019   ETH <10 80/16/5537    Metabolic Disorder Labs: Lab Results  Component Value Date   HGBA1C 5.3 02/10/2019   MPG 105.41 02/10/2019   MPG 108.28 05/05/2018   Lab Results  Component Value Date   PROLACTIN 74.7 (H) 10/16/2018   PROLACTIN 20.9 05/05/2018   Lab Results  Component Value Date   CHOL 164 02/10/2019   TRIG 122 02/10/2019   HDL 38 (L) 02/10/2019   CHOLHDL 4.3 02/10/2019   VLDL 24 02/10/2019   LDLCALC 102 (H) 02/10/2019   LDLCALC 90 05/05/2018    Physical Findings: AIMS: Facial and Oral Movements Muscles of Facial Expression: None, normal Lips and Perioral Area: None, normal Jaw: None, normal Tongue: None, normal,Extremity Movements Upper (arms, wrists, hands, fingers): None, normal Lower (legs, knees, ankles, toes): None, normal, Trunk Movements Neck, shoulders, hips: None, normal, Overall Severity Severity of abnormal movements (highest score from questions above):  None, normal Incapacitation due to abnormal movements: None, normal Patient's awareness of abnormal movements (rate only patient's report): No Awareness, Dental Status Current problems with teeth and/or dentures?: No Does patient usually wear dentures?: No  CIWA:  CIWA-Ar Total: 7 COWS:  COWS Total Score: 4  Musculoskeletal: Strength & Muscle Tone: within normal limits Gait & Station: normal Patient leans: N/A  Psychiatric Specialty Exam: Physical Exam  Nursing note and vitals reviewed. Constitutional: She is oriented to person, place, and time. She appears well-developed and well-nourished.  Respiratory: Effort normal.  Musculoskeletal: Normal range of motion.  Neurological: She is alert and oriented to person, place, and time.  Skin: Skin is warm.    Review of Systems  Constitutional: Negative.   HENT: Negative.   Eyes: Negative.   Respiratory: Negative.   Cardiovascular: Negative.   Gastrointestinal: Negative.   Genitourinary: Negative.   Musculoskeletal: Negative.   Skin: Negative.   Neurological: Negative.   Endo/Heme/Allergies: Negative.   Psychiatric/Behavioral: Positive for depression.  denies chest pain,no shortness of breath, no vomiting , denies dysuria , urgency or pelvic pain, discomfort. Reports improving/resolving vaginal bleeding ( Status postpartum)  no fever or chills   Blood pressure (!) 102/57, pulse (!) 56, temperature 98.5 F (36.9 C), temperature source Oral, resp. rate 20, height '5\' 7"'$  (1.702  m), weight 121.6 kg, SpO2 96 %, unknown if currently breastfeeding.Body mass index is 41.97 kg/m.  General Appearance: improving grooming   Eye Contact:  Good  Speech:  Normal Rate  Volume:  Normal  Mood:  improving mood, states she is feeling better  Affect:  less irritable today, more reactive  Thought Process:  Linear and Descriptions of Associations: Intact  Orientation:  Other:  fully alert and attentive  Thought Content:  currently denies  hallucinations, not internally preoccupied, no delusions expressed  Suicidal Thoughts:  No denies suicidal or self injurious ideations, denies homicidal ideations, specifically denies any homicidal or violent ideations towards her child, contracts for safety on unit   Homicidal Thoughts:  No  Memory:  recent and remote grossly intact   Judgement:  Other:  fair- improving   Insight:  Fair and improving   Psychomotor Activity:  Normal- no psycho motor restlessness or agitation today  Concentration:  Concentration: Good and Attention Span: Good  Recall:  Good  Fund of Knowledge:  Good  Language:  Good  Akathisia:  No  Handed:  Right  AIMS (if indicated):     Assets:  Communication Skills Desire for Improvement Resilience Social Support Transportation  ADL's:  Intact  Cognition:  WNL  Sleep:  Number of Hours: 5.75   Assessment -  28 year old female, currently two weeks post partum , presented under IVC reporting that patient had made threat to hurt her child. Patient categorically denies this, and states she has had no thoughts of hurting her child or anyone else, and also denies any SI. She does report history of mental illness, and reports past history of Bipolar Disorder , PTSD, Borderline Personality Disorder diagnosis. States she had done well with Latuda in the past, but had stopped during pregnancy. Is interested in restarting this medication.  Currently patient reports feeling better after a positive phone conversation with her [place] worker. She presents a lot calmer today, better related , with a more reactive affect, improved eye contact, and speech not loud or yelling today.  She is tolerating Latuda well, which she states has been effective/ well tolerated  for her in the past. She denies any HI or violent ideations towards her child , and states that text she sent was misconstrued and that she did not intend to convey she was having any thoughts of hurting her child.    Treatment  Plan Summary: Daily contact with patient to assess and evaluate symptoms and progress in treatment, Medication management and Plan is to:  Treatment plan reviewed as below today 6/15 Encourage group and milieu participation to work on coping skills and symptom reduction Continue Latuda 40 mg PO Daily for mood stability Continue Trazodone 50 mg po qhS PRN for insomnia Continue Vistaril 25 mg PO TID PRN for anxiety Treatment team working on disposition planning options  Jenne Campus, MD 02/11/2019, 12:31 PM   Patient ID: [name], female   DOB: Sep 17, 1990, 28 y.o.   MRN: [id number]

## 2019-02-11 NOTE — Tx Team (Signed)
Interdisciplinary Treatment and Diagnostic Plan Update  02/11/2019 Time of Session: 09:32am Tina Wood MRN: [id number]  Principal Diagnosis: Bipolar I disorder, most recent episode depressed ([place])  Secondary Diagnoses: Principal Problem:   Bipolar I disorder, most recent episode depressed ([place])   Current Medications:  Current Facility-Administered Medications  Medication Dose Route Frequency Provider Last Rate Last Dose  . alum & mag hydroxide-simeth (MAALOX/MYLANTA) 200-200-20 MG/5ML suspension 30 mL  30 mL Oral Q4H PRN Money, Lowry Ram, FNP      . hydrOXYzine (ATARAX/VISTARIL) tablet 25 mg  25 mg Oral TID PRN Money, Lowry Ram, FNP   25 mg at 02/10/19 2211  . ibuprofen (ADVIL) tablet 600 mg  600 mg Oral Q6H PRN Money, Lowry Ram, FNP      . lurasidone (LATUDA) tablet 40 mg  40 mg Oral Q breakfast Money, Lowry Ram, FNP   40 mg at 02/11/19 0803  . magnesium hydroxide (MILK OF MAGNESIA) suspension 30 mL  30 mL Oral Daily PRN Money, Lowry Ram, FNP      . traZODone (DESYREL) tablet 50 mg  50 mg Oral QHS PRN Money, Lowry Ram, FNP   50 mg at 02/10/19 2210   PTA Medications: Medications Prior to Admission  Medication Sig Dispense Refill Last Dose  . hydrOXYzine (ATARAX/VISTARIL) 25 MG tablet Take 1 tablet (25 mg total) by mouth 3 (three) times daily as needed for anxiety. (Patient not taking: Reported on 02/08/2019) 30 tablet 0   . ibuprofen (ADVIL) 600 MG tablet Take 1 tablet (600 mg total) by mouth every 6 (six) hours. (Patient not taking: Reported on 02/08/2019) 30 tablet 0   . Levonorgestrel (LILETTA) 19.5 MCG/DAY IUD IUD by Intrauterine route once for 1 dose. 1 each 0   . lurasidone (LATUDA) 40 MG TABS tablet Take 1 tablet (40 mg total) by mouth daily with breakfast. 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   Physician Treatment Plan for Primary Diagnosis: Bipolar I disorder, most recent episode depressed ([place]) Long Term Goal(s): Improvement in symptoms so as ready for discharge Improvement in symptoms so as ready for discharge   Short Term Goals: Ability to identify changes in lifestyle to reduce recurrence of condition will improve Ability to verbalize feelings will improve Ability to disclose and discuss suicidal ideas Ability to demonstrate self-control will improve Ability to identify and develop effective coping behaviors will improve Ability to maintain clinical measurements within normal limits will improve Ability to identify changes in lifestyle to reduce recurrence of condition will improve Ability to verbalize feelings will improve Ability to disclose and discuss suicidal ideas Ability to demonstrate self-control will improve Ability to identify and develop effective coping behaviors will improve Ability to maintain clinical measurements within normal limits will improve  Medication Management: Evaluate patient's response, side effects, and tolerance of medication regimen.  Therapeutic Interventions: 1 to 1 sessions, Unit Group sessions and Medication administration.  Evaluation of Outcomes: Progressing  Physician Treatment Plan for Secondary Diagnosis: Principal Problem:   Bipolar I disorder, most recent episode depressed ([place])  Long Term Goal(s): Improvement in symptoms so as ready for discharge Improvement in symptoms so as ready for discharge   Short Term Goals: Ability to identify changes in lifestyle to reduce recurrence of condition will improve Ability to verbalize feelings  will improve Ability to disclose and discuss suicidal ideas Ability to demonstrate self-control will improve Ability to identify and develop effective coping behaviors will  improve Ability to maintain clinical measurements within normal limits will improve Ability to identify changes in lifestyle to reduce recurrence of condition will improve Ability to verbalize feelings will improve Ability to disclose and discuss suicidal ideas Ability to demonstrate self-control will improve Ability to identify and develop effective coping behaviors will improve Ability to maintain clinical measurements within normal limits will improve     Medication Management: Evaluate patient's response, side effects, and tolerance of medication regimen.  Therapeutic Interventions: 1 to 1 sessions, Unit Group sessions and Medication administration.  Evaluation of Outcomes: Progressing   RN Treatment Plan for Primary Diagnosis: Bipolar I disorder, most recent episode depressed ([place]) Long Term Goal(s): Knowledge of disease and therapeutic regimen to maintain health will improve  Short Term Goals: Ability to participate in decision making will improve, Ability to verbalize feelings will improve, Ability to disclose and discuss suicidal ideas, Ability to identify and develop effective coping behaviors will improve and Compliance with prescribed medications will improve  Medication Management: RN will administer medications as ordered by provider, will assess and evaluate patient's response and provide education to patient for prescribed medication. RN will report any adverse and/or side effects to prescribing provider.  Therapeutic Interventions: 1 on 1 counseling sessions, Psychoeducation, Medication administration, Evaluate responses to treatment, Monitor vital signs and CBGs as ordered, Perform/monitor CIWA, COWS, AIMS and Fall Risk screenings as ordered, Perform wound care treatments as ordered.  Evaluation of Outcomes: Progressing   LCSW Treatment Plan for Primary Diagnosis: Bipolar I disorder, most recent episode depressed ([place]) Long Term Goal(s): Safe transition to appropriate  next level of care at discharge, Engage patient in therapeutic group addressing interpersonal concerns.  Short Term Goals: Engage patient in aftercare planning with referrals and resources and Increase skills for wellness and recovery  Therapeutic Interventions: Assess for all discharge needs, 1 to 1 time with Social worker, Explore available resources and support systems, Assess for adequacy in community support network, Educate family and significant other(s) on suicide prevention, Complete Psychosocial Assessment, Interpersonal group therapy.  Evaluation of Outcomes: Progressing   Progress in Treatment: Attending groups: No. Participating in groups: No. Taking medication as prescribed: Yes. Toleration medication: Yes. Family/Significant other contact made: Yes, individual(s) contacted:  pt's boyfriend Patient understands diagnosis: Yes. Discussing patient identified problems/goals with staff: Yes. Medical problems stabilized or resolved: Yes. Denies suicidal/homicidal ideation: Yes. Issues/concerns per patient self-inventory: No. Other:   New problem(s) identified: No, Describe:  None  New Short Term/Long Term Goal(s): Medication stabilization, elimination of SI thoughts, and development of a comprehensive mental wellness plan.   Patient Goals:  "I want to get back on my meds to be be stable enough to be there for my kids"  Discharge Plan or Barriers: CSW will continue to follow up for appropriate referrals and possible discharge planning  Reason for Continuation of Hospitalization: Anxiety Depression Medication stabilization  Estimated Length of Stay: 2-3 days  Attendees: Patient: 02/11/2019   Physician: Dr. Neita Garnet, MD 02/11/2019   Nursing: Rise Paganini, RN 02/11/2019   RN Care Manager: 02/11/2019   Social Worker: Ardelle Anton, LCSW 02/11/2019   Recreational Therapist:  02/11/2019   Other:  02/11/2019   Other:  02/11/2019   Other: 02/11/2019      Scribe for Treatment  Team: Trecia Rogers, LCSW 02/11/2019 11:12 AM

## 2019-02-11 NOTE — Progress Notes (Signed)
CSW spoke with patient at bedside, per patient request. Patient presents as tearful, demanding discharge. States she is trying to rent a room from a friend and she gave the friend $300 of her SSDI, she reports that if she is not discharged this afternoon, her room will be given away and she will be homeless at discharge.  She declines shelter resources and information for halfway houses, stating that she will have her baby with her (highly unlikely due to CPS involvement) and she won't have her baby around other people.  She wants to follow up with [facility] for [facility], reports she has a post-partum appointment at a clinic in [place] tomorrow at [place] offered to call and reschedule the appointment, but patient refuses.  Stephanie Acre, LCSW-A Clinical Social Worker

## 2019-02-11 NOTE — Plan of Care (Signed)
Nurse discussed anxiety, depression, coping skills with patient. 

## 2019-02-11 NOTE — Progress Notes (Signed)
D   Pt anxious and sad   She said she is supposed to be discharged tomorrow   Her mood is more positive this evening   She interacts well with others A   Verbal support given   medicaations administered and effectiveness monitored   Q 15 min checks R  Pt remains safe at this time  [place] NOVEL CORONAVIRUS (COVID-19) DAILY CHECK-OFF SYMPTOMS - answer yes or no to each - every day NO YES  Have you had a fever in the past 24 hours?  . Fever (Temp > 37.80C / 100F) X   Have you had any of these symptoms in the past 24 hours? . New Cough .  Sore Throat  .  Shortness of Breath .  Difficulty Breathing .  Unexplained Body Aches   X   Have you had any one of these symptoms in the past 24 hours not related to allergies?   . Runny Nose .  Nasal Congestion .  Sneezing   X   If you have had runny nose, nasal congestion, sneezing in the past 24 hours, has it worsened?  X   EXPOSURES - check yes or no X   Have you traveled outside the state in the past 14 days?  X   Have you been in contact with someone with a confirmed diagnosis of COVID-19 or PUI in the past 14 days without wearing appropriate PPE?  X   Have you been living in the same home as a person with confirmed diagnosis of COVID-19 or a PUI (household contact)?    X   Have you been diagnosed with COVID-19?    X              What to do next: Answered NO to all: Answered YES to anything:   Proceed with unit schedule Follow the [facility] Inpatient Flowsheet.

## 2019-02-11 NOTE — Progress Notes (Signed)
CSW attempted to return call to [facility], Armanda Magic [phone]). CSW left a HIPAA compliant voicemail with callback information.  Stephanie Acre, LCSW-A Clinical Social Worker

## 2019-02-11 NOTE — Progress Notes (Signed)
D:  Patient refused to fill out her self inventory sheet today.  Patient was upset this morning but is more calm this afternoon.  Patient has been sitting in dayroom playing cards with peers, laughing, talking. A:  Medications administered per MD orders.  Emotional support and encouragement given patient. R:  Patient denied SI and HI, contracts for safety.  Denied A/V hallucinations.  Safety maintained with 15 minute checks.

## 2019-02-12 ENCOUNTER — Encounter (HOSPITAL_COMMUNITY): Payer: Self-pay | Admitting: *Deleted

## 2019-02-12 MED ORDER — LURASIDONE HCL 60 MG PO TABS
60.0000 mg | ORAL_TABLET | Freq: Every day | ORAL | Status: DC
  Administered 2019-02-12: 60 mg via ORAL
  Filled 2019-02-12: qty 7
  Filled 2019-02-12: qty 1
  Filled 2019-02-12: qty 7

## 2019-02-12 MED ORDER — HYDROXYZINE HCL 50 MG PO TABS
ORAL_TABLET | ORAL | Status: AC
  Administered 2019-02-12: 12:00:00
  Filled 2019-02-12: qty 1

## 2019-02-12 MED ORDER — HYDROXYZINE HCL 50 MG PO TABS
50.0000 mg | ORAL_TABLET | ORAL | Status: AC
  Administered 2019-02-12: 50 mg via ORAL

## 2019-02-12 MED ORDER — LURASIDONE HCL 40 MG PO TABS: 40.0000 mg | ORAL_TABLET | Freq: Every day | ORAL | 0 refills | Status: DC

## 2019-02-12 MED ORDER — HYDROXYZINE HCL 25 MG PO TABS: 25.0000 mg | ORAL_TABLET | Freq: Three times a day (TID) | ORAL | 0 refills | Status: DC | PRN

## 2019-02-12 MED ORDER — ASPIRIN-ACETAMINOPHEN-CAFFEINE 250-250-65 MG PO TABS
2.0000 | ORAL_TABLET | Freq: Four times a day (QID) | ORAL | Status: DC | PRN
  Administered 2019-02-12 (×2): 2 via ORAL
  Filled 2019-02-12 (×2): qty 2

## 2019-02-12 MED ORDER — TRAZODONE HCL 50 MG PO TABS: 50.0000 mg | ORAL_TABLET | Freq: Every evening | ORAL | 0 refills | Status: DC | PRN

## 2019-02-12 NOTE — Progress Notes (Signed)
Psychoeducational Group Note  Date:  02/12/2019 Time:  2324  Group Topic/Focus:  Wrap-Up Group:   The focus of this group is to help patients review their daily goal of treatment and discuss progress on daily workbooks.  Participation Level: Did Not Attend  Participation Quality:  Not Applicable  Affect:  Not Applicable  Cognitive:  Not Applicable  Insight:  Not Applicable  Engagement in Group: Not Applicable  Additional Comments:  The patient did not attend group this evening.   Archie Balboa S 02/12/2019, 11:24 PM

## 2019-02-12 NOTE — [facility] Suicide Risk Assessment (Signed)
[facility] Discharge Suicide Risk Assessment   Principal Problem: Bipolar I disorder, most recent episode depressed ([place]) Discharge Diagnoses: Principal Problem:   Bipolar I disorder, most recent episode depressed ([place])   Total Time spent with patient: 15 minutes  Musculoskeletal: Strength & Muscle Tone: within normal limits Gait & Station: normal Patient leans: N/A  Psychiatric Specialty Exam: Review of Systems  All other systems reviewed and are negative.   Blood pressure (!) 109/58, pulse 62, temperature (!) 97.5 F (36.4 C), temperature source Oral, resp. rate 18, height 5\' 7"  (1.702 m), weight 121.6 kg, SpO2 100 %, unknown if currently breastfeeding.Body mass index is 41.97 kg/m.  General Appearance: Casual  Eye Contact::  Fair  Speech:  Normal Rate409  Volume:  Normal  Mood:  Anxious  Affect:  Congruent  Thought Process:  Coherent and Descriptions of Associations: Intact  Orientation:  Full (Time, Place, and Person)  Thought Content:  Logical  Suicidal Thoughts:  No  Homicidal Thoughts:  No  Memory:  Immediate;   Fair Recent;   Fair Remote;   Fair  Judgement:  Fair  Insight:  Fair  Psychomotor Activity:  Increased  Concentration:  Fair  Recall:  [employer] of Knowledge:Fair  Language: Good  Akathisia:  Negative  Handed:  Right  AIMS (if indicated):     Assets:  Desire for Improvement Resilience  Sleep:  Number of Hours: 6.25  Cognition: WNL  ADL's:  Intact   Mental Status Per Nursing Assessment::   On Admission:  NA  Demographic Factors:  Caucasian, Low socioeconomic status and Unemployed  Loss Factors: NA  Historical Factors: Impulsivity  Risk Reduction Factors:   Responsible for children under 80 years of age and Sense of responsibility to family  Continued Clinical Symptoms:  Bipolar Disorder:   Mixed State  Cognitive Features That Contribute To Risk:  None    Suicide Risk:  Minimal: No identifiable suicidal ideation.  Patients presenting  with no risk factors but with morbid ruminations; may be classified as minimal risk based on the severity of the depressive symptoms  Follow-up Information    [facility] Follow up.   Why: Telephonic hospital follow up appointment is ...  Contact information: [place] [phone] [phone]           Plan Of Care/Follow-up recommendations:  Activity:  ad lib  Sharma Covert, MD 02/12/2019, 10:21 AM

## 2019-02-12 NOTE — Progress Notes (Signed)
[facility] MD Progress Note  02/12/2019 1:26 PM Tina Wood  MRN:  [id number] Subjective: Patient is a 28 year old female with a past psychiatric history significant for reported bipolar disorder, posttraumatic stress disorder, probable borderline personality disorder.  She was admitted on 02/09/2019 secondary to suicidal ideation and postpartum depression.  Objective: Patient is seen and examined.  Patient is a 27 year old female with the above-stated past psychiatric history who is seen in follow-up.  The patient was seen on 3 occasions today.  This occupied approximately 1 hour of evaluation.  Initially it was an attempt to assist her with discharge.  She stated that she needed to be discharged today so that she could get to the housing complex that she needed.  She had to leave her grandparents home, and it was important for her to regain custody of her child by having a living circumstance.  She stated that she needed to be there by 5:00 today.  Initially social work and I assessed with all the people involved.  She stated that she needed transportation by her grandfather, but then found out that he was unwilling to transport her.  She became very upset over this.  Additional information was collected, and the patient was under the impression that she would be accompanied by CPS to evaluate the living circumstances and to see if it would be adequate.  Unfortunately it appears as though the child will remain with the grandparents, and that given the circumstances she would not get custody of her 47-week-old child on discharge.  She became additionally distressed by this.  She became tearful and agitated.  She did get into argumentative situations on the telephone with child protective services as well as her grandparents.  On the third meeting today she remains significantly tearful and agitated.  The decision was made not to discharge her from the hospital.  Child protective services is planning on meeting  with her tomorrow morning and a decision for disposition will be made at that time given her reaction.  She initially denied any suicidal or homicidal ideation.  She does remain moderately labile.  Her vital signs are stable, she is afebrile.  She slept 6.25 hours last night.  Review of her laboratories showed a mildly decreased MCV at 79.9 and a negative drug screen.  Principal Problem: Bipolar I disorder, most recent episode depressed ([place]) Diagnosis: Principal Problem:   Bipolar I disorder, most recent episode depressed ([place])  Total Time spent with patient: 1 hour  Past Psychiatric History: Admission H&P  Past Medical History:  Past Medical History:  Diagnosis Date  . Asthma   . Brain tumor (benign) ([place])   . Seizures ([place])     Past Surgical History:  Procedure Laterality Date  . SKIN GRAFT    . WRIST SURGERY     Family History:  Family History  Family history unknown: Yes   Family Psychiatric  History: See admission H&P Social History:  Social History   Substance and Sexual Activity  Alcohol Use No     Social History   Substance and Sexual Activity  Drug Use No    Social History   Socioeconomic History  . Marital status: Single    Spouse name: Not on file  . Number of children: Not on file  . Years of education: Not on file  . Highest education level: Not on file  Occupational History  . Not on file  Social Needs  . Financial resource strain: Somewhat hard  . Food insecurity  Worry: Sometimes true    Inability: Sometimes true  . Transportation needs    Medical: No    Non-medical: Not on file  Tobacco Use  . Smoking status: Current Every Day Smoker    Packs/day: 1.00    Years: 9.00    Pack years: 9.00    Types: Cigarettes  . Smokeless tobacco: Former Systems developer    Types: Snuff  Substance and Sexual Activity  . Alcohol use: No  . Drug use: No  . Sexual activity: Yes    Birth control/protection: None  Lifestyle  . Physical activity    Days per week:  Not on file    Minutes per session: Not on file  . Stress: Very much  Relationships  . Social Herbalist on phone: Not on file    Gets together: Not on file    Attends religious service: Not on file    Active member of club or organization: Not on file    Attends meetings of clubs or organizations: Not on file    Relationship status: Not on file  Other Topics Concern  . Not on file  Social History Narrative  . Not on file   Additional Social History:    Pain Medications: See MAR Prescriptions: See MAR Over the Counter: See MAR History of alcohol / drug use?: No history of alcohol / drug abuse                    Sleep: Good  Appetite:  Good  Current Medications: Current Facility-Administered Medications  Medication Dose Route Frequency Provider Last Rate Last Dose  . alum & mag hydroxide-simeth (MAALOX/MYLANTA) 200-200-20 MG/5ML suspension 30 mL  30 mL Oral Q4H PRN Money, Lowry Ram, FNP      . aspirin-acetaminophen-caffeine (EXCEDRIN MIGRAINE) per tablet 2 tablet  2 tablet Oral Q6H PRN Sharma Covert, MD   2 tablet at 02/12/19 1150  . hydrOXYzine (ATARAX/VISTARIL) tablet 25 mg  25 mg Oral TID PRN Money, Lowry Ram, FNP   25 mg at 02/12/19 1150  . ibuprofen (ADVIL) tablet 600 mg  600 mg Oral Q6H PRN Money, Lowry Ram, FNP   600 mg at 02/11/19 2127  . Lurasidone HCl TABS 60 mg  60 mg Oral Q supper Sharma Covert, MD      . magnesium hydroxide (MILK OF MAGNESIA) suspension 30 mL  30 mL Oral Daily PRN Money, Lowry Ram, FNP      . traZODone (DESYREL) tablet 50 mg  50 mg Oral QHS PRN Money, Lowry Ram, FNP   50 mg at 02/11/19 2231    Lab Results: No results found for this or any previous visit (from the past 48 hour(s)).  Blood Alcohol level:  Lab Results  Component Value Date   ETH <10 02/08/2019   ETH <10 85/46/2703    Metabolic Disorder Labs: Lab Results  Component Value Date   HGBA1C 5.3 02/10/2019   MPG 105.41 02/10/2019   MPG 108.28 05/05/2018    Lab Results  Component Value Date   PROLACTIN 74.7 (H) 10/16/2018   PROLACTIN 20.9 05/05/2018   Lab Results  Component Value Date   CHOL 164 02/10/2019   TRIG 122 02/10/2019   HDL 38 (L) 02/10/2019   CHOLHDL 4.3 02/10/2019   VLDL 24 02/10/2019   LDLCALC 102 (H) 02/10/2019   LDLCALC 90 05/05/2018    Physical Findings: AIMS: Facial and Oral Movements Muscles of Facial Expression: None, normal Lips and Perioral  Area: None, normal Jaw: None, normal Tongue: None, normal,Extremity Movements Upper (arms, wrists, hands, fingers): None, normal Lower (legs, knees, ankles, toes): None, normal, Trunk Movements Neck, shoulders, hips: None, normal, Overall Severity Severity of abnormal movements (highest score from questions above): None, normal Incapacitation due to abnormal movements: None, normal Patient's awareness of abnormal movements (rate only patient's report): No Awareness, Dental Status Current problems with teeth and/or dentures?: No Does patient usually wear dentures?: No  CIWA:  CIWA-Ar Total: 1 COWS:  COWS Total Score: 2  Musculoskeletal: Strength & Muscle Tone: within normal limits Gait & Station: normal Patient leans: N/A  Psychiatric Specialty Exam: Physical Exam  Nursing note and vitals reviewed. Constitutional: She is oriented to person, place, and time. She appears well-developed and well-nourished.  HENT:  Head: Normocephalic and atraumatic.  Respiratory: Effort normal.  Neurological: She is alert and oriented to person, place, and time.    ROS  Blood pressure (!) 109/58, pulse 62, temperature (!) 97.5 F (36.4 C), temperature source Oral, resp. rate 18, height 5\' 7"  (1.702 m), weight 121.6 kg, SpO2 100 %, unknown if currently breastfeeding.Body mass index is 41.97 kg/m.  General Appearance: Casual  Eye Contact:  Fair  Speech:  Normal Rate  Volume:  Increased  Mood:  Anxious and Irritable  Affect:  Labile  Thought Process:  Coherent and  Descriptions of Associations: Intact  Orientation:  Full (Time, Place, and Person)  Thought Content:  Negative  Suicidal Thoughts:  No  Homicidal Thoughts:  No  Memory:  Immediate;   Fair Recent;   Fair Remote;   Fair  Judgement:  Impaired  Insight:  Lacking  Psychomotor Activity:  Increased  Concentration:  Concentration: Fair and Attention Span: Fair  Recall:  [employer] of Knowledge:  Fair  Language:  Good  Akathisia:  Negative  Handed:  Right  AIMS (if indicated):     Assets:  Desire for Improvement Resilience  ADL's:  Intact  Cognition:  WNL  Sleep:  Number of Hours: 6.25     Treatment Plan Summary: Daily contact with patient to assess and evaluate symptoms and progress in treatment, Medication management and Plan : Patient is seen and examined.  Patient is a 28 year old female with the above-stated past psychiatric history who is seen in follow-up.   Gnosis: #1 bipolar disorder unspecified, #2 posttraumatic stress disorder, #3 borderline personality disorder, #4 prolactinoma, #5 migraine headaches  Patient is seen and examined.  Patient is a 28 year old female with the above-stated past psychiatric history seen in follow-up.  It was possible that she was going to be able to be discharged today so that she could arrange her housing and attempt to show child protective services and environment which her 6-week-old child could return to.  Unfortunately the psychosocial stressors around the custody issue has led to more emotional dysregulation on her part.  The decision was made that the meeting with child protective services and the patient would be beneficial to be had on the unit so that if she became agitated we could assist with calling her.  Also I have decided to change her Latuda to 60 mg, and to give it to her at bedtime.  On her first meeting she did complain of some mild sedation from that.  Additionally she complained of a migraine headache because of all that was going  on, and she stated that Excedrin Migraine did assist with that and that has been ordered.  We did order a one-time dose of  hydroxyzine 50 mg x 1 given her increased anxiety over all the confusion about her discharge.  She will also continue on trazodone 50 mg p.o. nightly as needed insomnia. 1.  Increase Latuda to 60 mg and change timing of medication to daily with supper for mood regulation. 2.  Excedrin Migraine every 6 hours as needed migraine headache. 3.  Hydroxyzine 25 mg p.o. 3 times daily as needed anxiety. 4.  Ibuprofen 600 mg p.o. every 6 hours as needed headache. 5.  Trazodone 50 mg p.o. nightly as needed insomnia. 6.  Disposition planning-in progress.   Sharma Covert, MD 02/12/2019, 1:26 PM

## 2019-02-12 NOTE — Progress Notes (Signed)
Spiritual care group on grief and loss facilitated by chaplain Jerene Pitch  Group Goal:  Support / Education around grief and loss Members engage in facilitated group support and psycho social education.  Group Description:  Following introductions and group rules,  Group members engaged in facilitated group dialog and support around topic of loss, with particular support around experiences of loss in their lives. Group Identified types of loss (relationships / self / things) and identified patterns, circumstances, and changes that precipitate losses. Reflected on thoughts / feelings around loss, normalized grief responses, and recognized variety in grief experience. Patient Progress:  Pt did not attend group

## 2019-02-12 NOTE — Progress Notes (Addendum)
CSW spoke with patient on unit to review discharge plans and outpatient follow up.  Patient reports she will discharge today to a new apartment in [facility]. She will have a friend pick her up and take her to move into a room at her friend Salomon Mast apartment. Patient states she will not present to her grandparent's home. She states she will contact [place] Social Worker, Armanda Magic [phone]) at discharge to schedule a home visit, in an effort to be reunited with her baby.  She is agreeable to follow up with [facility] for outpatient in [facility].   CSW contacted [place] Social Worker, Armanda Magic [phone]) to review plan. CPS is aware of plan. CSW encouraged patient to call CPS prior to discharge as well after after discharge.  Stephanie Acre, LCSW-A Clinical Social Worker

## 2019-02-12 NOTE — Progress Notes (Signed)
Patient ID: Tina Wood, female   DOB: 1991/04/24, 28 y.o.   MRN: [id number] D. Patients affect is anxious and depressed. She is concerned about losing an apartment she is to rent today if she is not discharged. A. Meds given as ordered. Patient encouraged to talk to MD and social worker. R. Patient cooperative and open to support. Patient having no problem with medication.

## 2019-02-12 NOTE — Progress Notes (Signed)
CSW established contact with patient's roommate, Tonita Cong [phone]). He states patient is expected to live with him at: [place], [place] [address]  CSW shared this information with [place] Social Worker, Armanda Magic [phone]). Discharge is now planned for tomorrow after an in person meeting with CPS at [facility]. Meeting is scheduled for 10:00am tomorrow (06/17).  Stephanie Acre, LCSW-A Clinical Social Worker

## 2019-02-12 NOTE — Progress Notes (Signed)
CSW attempted to reach patient's friend, Tonita Cong [phone] to confirm that patient will be living with him at discharge. The phone number had a different name associated with the voicemail, CSW left a HIPAA compliant voicemail with callback information. CSW called a second time with no answer.   [place] Social Worker, Armanda Magic would like to meet with patient in person prior to her discharge, as she hung up with him on the phone. CSW staffed this request with [facility], Otila Kluver. Otila Kluver is agreeable to accommodate this request if psychiatry is agreeable.   [place] Social Worker is aware of potential discharge, he reports he may try to meet with patient in the community tomorrow.   Stephanie Acre, LCSW-A Clinical Social Worker

## 2019-02-12 NOTE — Progress Notes (Signed)
Patient ID: Tina Wood, female   DOB: 1990/09/04, 27 y.o.   MRN: [id number]  [place] NOVEL CORONAVIRUS (COVID-19) DAILY CHECK-OFF SYMPTOMS - answer yes or no to each - every day NO YES  Have you had a fever in the past 24 hours?  . Fever (Temp > 37.80C / 100F) X   Have you had any of these symptoms in the past 24 hours? . New Cough .  Sore Throat  .  Shortness of Breath .  Difficulty Breathing .  Unexplained Body Aches   X   Have you had any one of these symptoms in the past 24 hours not related to allergies?   . Runny Nose .  Nasal Congestion .  Sneezing   X   If you have had runny nose, nasal congestion, sneezing in the past 24 hours, has it worsened?  X   EXPOSURES - check yes or no X   Have you traveled outside the state in the past 14 days?  X   Have you been in contact with someone with a confirmed diagnosis of COVID-19 or PUI in the past 14 days without wearing appropriate PPE?  X   Have you been living in the same home as a person with confirmed diagnosis of COVID-19 or a PUI (household contact)?    X   Have you been diagnosed with COVID-19?    X              What to do next: Answered NO to all: Answered YES to anything:   Proceed with unit schedule Follow the [facility] Inpatient Flowsheet.

## 2019-02-12 NOTE — Progress Notes (Signed)
The focus of this group is to help patients establish daily goals to achieve during treatment and discuss how the patient can incorporate goal setting into their daily lives to aide in recovery. 

## 2019-02-12 NOTE — Progress Notes (Deleted)
  [facility] Adult Case Management Discharge Plan :  Will you be returning to the same living situation after discharge:  No. Moving in with a friend in [place]. At discharge, do you have transportation home?: Yes,  taking the bus. Do you have the ability to pay for your medications: Yes,  Medicaid.  Release of information consent forms completed and in the chart.  Patient to Follow up at: Follow-up Information    [facility] Follow up on 02/19/2019.   Why: Telephonic hospital follow up appointment is  Tuesday, 6/23 at 9:00a.  The provider will call you on the day of the appointment.  Contact information: [place] [address] [phone]           Next level of care provider has access to [place] and Suicide Prevention discussed: Yes,  with ex-boyfriend. [place] social worker, Richard [place].  Have you used any form of tobacco in the last 30 days? (Cigarettes, Smokeless Tobacco, Cigars, and/or Pipes): Yes  Has patient been referred to the Quitline?: Patient refused referral  Patient has been referred for addiction treatment: Pt. refused referral  Joellen [place], [place] 02/12/2019, 10:29 AM

## 2019-02-13 MED ORDER — LURASIDONE HCL 60 MG PO TABS: 60.0000 mg | ORAL_TABLET | Freq: Every day | ORAL | 0 refills | Status: DC

## 2019-02-13 NOTE — Discharge Summary (Signed)
Physician Discharge Summary Note  Patient:  Tina Wood is an 28 y.o., female MRN:  [id number] DOB:  February 04, 1991 Patient phone:  [phone] (home)  Patient address:   [address] [place] [address],  Total Time spent with patient: 15 minutes  Date of Admission:  02/08/2019 Date of Discharge: 02/13/19  Reason for Admission:  suicidal ideation  Principal Problem: Bipolar I disorder, most recent episode depressed [facility]) Discharge Diagnoses: Principal Problem:   Bipolar I disorder, most recent episode depressed ([place])   Past Psychiatric History: Per admission H&P: history of several past psychiatric admissions since adolescence. Most recent admission here at [facility] in December 2019. History of prior suicide attempts by overdosing , most recently in 2019. History of self cutting/self burning, but states she has not engaged in self injurious behaviors since 2017. Reports she has been diagnosed with Bipolar Disorder, Borderline Personality Disorder and PTSD in the past . Also endorses history of anxiety/panic symptoms, and endorses some agoraphobia  Denies history of psychosis, denies history of violence   Past Medical History:  Past Medical History:  Diagnosis Date  . Asthma   . Brain tumor (benign) ([place])   . Seizures ([place])     Past Surgical History:  Procedure Laterality Date  . SKIN GRAFT    . WRIST SURGERY     Family History:  Family History  Family history unknown: Yes   Family Psychiatric  History: reports there is a history of bipolar disorder in family ( brother, mother). States mother and sister have attempted suicide in the past . Social History:  Social History   Substance and Sexual Activity  Alcohol Use No     Social History   Substance and Sexual Activity  Drug Use No    Social History   Socioeconomic History  . Marital status: Single    Spouse name: Not on file  . Number of children: Not on file  . Years of education: Not on file  .  Highest education level: Not on file  Occupational History  . Not on file  Social Needs  . Financial resource strain: Somewhat hard  . Food insecurity    Worry: Sometimes true    Inability: Sometimes true  . Transportation needs    Medical: No    Non-medical: Not on file  Tobacco Use  . Smoking status: Current Every Day Smoker    Packs/day: 1.00    Years: 9.00    Pack years: 9.00    Types: Cigarettes  . Smokeless tobacco: Former Systems developer    Types: Snuff  Substance and Sexual Activity  . Alcohol use: No  . Drug use: No  . Sexual activity: Yes    Birth control/protection: None  Lifestyle  . Physical activity    Days per week: Not on file    Minutes per session: Not on file  . Stress: Very much  Relationships  . Social Herbalist on phone: Not on file    Gets together: Not on file    Attends religious service: Not on file    Active member of club or organization: Not on file    Attends meetings of clubs or organizations: Not on file    Relationship status: Not on file  Other Topics Concern  . Not on file  Social History Narrative  . Not on file    Hospital Course: From admission assessment: Tina Wood is an 28 y.o. female who presents to the ED under IVC initiated  by her grandfather. Per IVC respondent "does not take medication as prescribed. As of this morning, respondent is at a hotel with her 66 week old baby and texted her boyfriend that if something happens to the baby it will be her fault. Law enforcement is on the scene in fear of the safety of child. Grandparents advised respondent does have a history of mental breakdowns. Respondent advised that she may be having a panic attack as well." TTS spoke with the pt who continues to endorse SI without a plan. Pt endorses postpartum depression. Pt states she has attempted suicide multiple times in the past and has been admitted to several inpt facilities. Pt states she is prescribed Latuda but she does not take  it as prescribed. Pt states she recently had a baby about 2 weeks ago and she has been having trouble sleeping and loss of appetite since giving birth. Pt states she is now homeless and her child is living with her grandparents. Pt states she has no current provider however she is in the process of scheduling an appointment with [facility] for [place] needs. Pt's most recent OPT appointment with [facility] occurred in March 2020 via Telemedicine c/o Bipolar d/o. Pt states she has not seen a provider since that time.   From admission H&P: 28 year old single female, currently homeless. 2 weeks post partum ( full term- infant daughter now with patient's grandparents ). Patient presented to ED via [facility] , apparently contacted by her boyfriend. Chart notes indicate patient reportedly texted her boyfriend threatening to hurt the child. As per chart , IVC reports that patient said " if something happens to the baby it will be my fault".. Patient categorically denies this, states " I don't want to hurt anyone, and definitely I don't want to hurt my kid". States " all I did was tell him that I wanted to leave the baby with him because I needed to go to my grandparents and there was no room for her in the car". She does state she has been wanting to resume psychiatric medication ( reports she had been on Latuda, with good tolerance and good response ), which she states she had not taken during pregnancy, had briefly restarted after delivery, but took only briefly as she did not have refill script. Currently does endorse some depression, denies any recent suicidal ideations. Denies psychotic symptoms. She is currently future oriented, and states " all I want to is to get back on my medications and go be with my child" Endorses some neuro-vegetative symptoms as below, but in general states these symptoms have been mild, and denies any suicidal ideations. Of note, she also endorses some increased, rather than  decreased, energy.  Tina Wood was admitted under IVC after reportedly texting her boyfriend "if something happens to the baby it will be my fault." She reported SI. She was restarted on Latuda. Trazodone was started for sleep. She participated in group therapy on the unit. She had a meeting with CPS on day of discharge to discuss the plan for her baby. She is emotionally stable and reports she will be living with her grandparents after discharge. She remained on the [facility] unit for 5 days. She stabilized with medication and therapy. She was discharged on the medications listed below. She has shown improvement with improved mood, affect, sleep, appetite, and interaction. She denies any SI/HI/AVH and contracts for safety. She specifically denies HI toward her baby. She agrees to follow up at  [facility] (see below). Patient is provided with prescriptions and medication samples upon discharge. Her grandparents are picking her up for discharge home.  Physical Findings: AIMS: Facial and Oral Movements Muscles of Facial Expression: None, normal Lips and Perioral Area: None, normal Jaw: None, normal Tongue: None, normal,Extremity Movements Upper (arms, wrists, hands, fingers): None, normal Lower (legs, knees, ankles, toes): None, normal, Trunk Movements Neck, shoulders, hips: None, normal, Overall Severity Severity of abnormal movements (highest score from questions above): None, normal Incapacitation due to abnormal movements: None, normal Patient's awareness of abnormal movements (rate only patient's report): No Awareness, Dental Status Current problems with teeth and/or dentures?: No Does patient usually wear dentures?: No  CIWA:  CIWA-Ar Total: 1 COWS:  COWS Total Score: 2  Musculoskeletal: Strength & Muscle Tone: within normal limits Gait & Station: normal Patient leans: N/A  Psychiatric Specialty Exam: Physical Exam  Nursing note and vitals reviewed. Constitutional: She is oriented to person,  place, and time. She appears well-developed and well-nourished.  Cardiovascular: Normal rate.  Respiratory: Effort normal.  Neurological: She is alert and oriented to person, place, and time.    Review of Systems  Constitutional: Negative.   Psychiatric/Behavioral: Positive for depression (stable on medication). Negative for hallucinations, substance abuse and suicidal ideas. The patient is not nervous/anxious and does not have insomnia.     Blood pressure (!) 88/50, pulse (!) 59, temperature 97.6 F (36.4 C), temperature source Oral, resp. rate 18, height 5\' 7"  (1.702 m), weight 121.6 kg, SpO2 100 %, unknown if currently breastfeeding.Body mass index is 41.97 kg/m.  See MD's discharge SRA     Have you used any form of tobacco in the last 30 days? (Cigarettes, Smokeless Tobacco, Cigars, and/or Pipes): Yes  Has this patient used any form of tobacco in the last 30 days? (Cigarettes, Smokeless Tobacco, Cigars, and/or Pipes)  No  Blood Alcohol level:  Lab Results  Component Value Date   ETH <10 02/08/2019   ETH <10 32/07/2481    Metabolic Disorder Labs:  Lab Results  Component Value Date   HGBA1C 5.3 02/10/2019   MPG 105.41 02/10/2019   MPG 108.28 05/05/2018   Lab Results  Component Value Date   PROLACTIN 74.7 (H) 10/16/2018   PROLACTIN 20.9 05/05/2018   Lab Results  Component Value Date   CHOL 164 02/10/2019   TRIG 122 02/10/2019   HDL 38 (L) 02/10/2019   CHOLHDL 4.3 02/10/2019   VLDL 24 02/10/2019   LDLCALC 102 (H) 02/10/2019   [place] 90 05/05/2018    See Psychiatric Specialty Exam and Suicide Risk Assessment completed by Attending Physician prior to discharge.  Discharge destination:  Home  Is patient on multiple antipsychotic therapies at discharge:  No   Has Patient had three or more failed trials of antipsychotic monotherapy by history:  No  Recommended Plan for Multiple Antipsychotic Therapies: NA  Discharge Instructions    Discharge instructions    Complete by: As directed    Patient is instructed to take all prescribed medications as recommended. Report any side effects or adverse reactions to your outpatient psychiatrist. Patient is instructed to abstain from alcohol and illegal drugs while on prescription medications. In the event of worsening symptoms, patient is instructed to call the crisis hotline, 911, or go to the nearest emergency department for evaluation and treatment.   Discharge instructions   Complete by: As directed    Patient is instructed to take all prescribed medications as recommended. Report any side effects or adverse reactions  to your outpatient psychiatrist. Patient is instructed to abstain from alcohol and illegal drugs while on prescription medications. In the event of worsening symptoms, patient is instructed to call the crisis hotline, 911, or go to the nearest emergency department for evaluation and treatment.     Allergies as of 02/13/2019      Reactions   Bee Venom Swelling, Anaphylaxis   Cherry Anaphylaxis   Coconut Flavor Anaphylaxis   Anything with coconut-throat swells   Coconut Oil Anaphylaxis, Rash   Fish Allergy Anaphylaxis   Pomegranate [punica] Anaphylaxis   Cranberry Rash   Swelling and rash   Other Rash, Other (See Comments)   Ultrasound gel   Shellfish Allergy Diarrhea, Swelling   Latex Itching   Tape Rash      Medication List    STOP taking these medications   ibuprofen 600 MG tablet Commonly known as: ADVIL     TAKE these medications     Indication  hydrOXYzine 25 MG tablet Commonly known as: ATARAX/VISTARIL Take 1 tablet (25 mg total) by mouth 3 (three) times daily as needed for anxiety.  Indication: Feeling Anxious   levonorgestrel 19.5 MCG/DAY Iud IUD Commonly known as: LILETTA by Intrauterine route once for 1 dose.  Indication: Birth Control Treatment   Lurasidone HCl 60 MG Tabs Take 1 tablet (60 mg total) by mouth daily with supper. What changed:    medication strength  how much to take  when to take this  Indication: Depressive Phase of Manic-Depression   traZODone 50 MG tablet Commonly known as: DESYREL Take 1 tablet (50 mg total) by mouth at bedtime as needed for sleep.  Indication: Trouble Sleeping      Follow-up Information    [facility] Follow up on 02/19/2019.   Why: Telephonic hospital follow up appointment is  Tuesday, 6/23 at 9:00a.  The provider will call you on the day of the appointment.  Contact information: [place] [address] [phone]           Follow-up recommendations: Activity as tolerated. Diet as recommended by primary care physician. Keep all scheduled follow-up appointments as recommended.   Comments:   Patient is instructed to take all prescribed medications as recommended. Report any side effects or adverse reactions to your outpatient psychiatrist. Patient is instructed to abstain from alcohol and illegal drugs while on prescription medications. In the event of worsening symptoms, patient is instructed to call the crisis hotline, 911, or go to the nearest emergency department for evaluation and treatment.  Signed: Connye Burkitt, NP 02/13/2019, 2:30 PM

## 2019-02-13 NOTE — BHH Group Notes (Signed)
Occupational Therapy Group Note  Date:  02/13/2019 Time:  11:47 AM  Group Topic/Focus:  Self Esteem Action Plan:   The focus of this group is to help patients create a plan to continue to build self-esteem after discharge.  Participation Level:  Active  Participation Quality:  Appropriate  Affect:  Blunted  Cognitive:  Appropriate  Insight: Improving  Engagement in Group:  Engaged  Modes of Intervention:  Activity, Discussion, Education and Socialization  Additional Comments:    S: "being around my children helps to increase my self esteem"  O: OT tx with focus on self esteem building this date. Education given on definition of self esteem, with both causes of low and high self esteem identified. Activity given for pt to identify a positive/aspiring trait for each letter of the alphabet. Pt to work with peers to help complete activity and build positive thinking.   A: Pt presents with blunted affect, engaged and participatory throughout session. Pt shares how playing with her children and positive social support helps to increase her self esteem, as well as receiving compliments. Pt completed A-Z activity and proudly shared findings at end of session.  P: OT group will be x1 per week while pt inpatient.  Zenovia Jarred, MSOT, OTR/L Behavioral Health OT/ Acute Relief OT PHP Office: [place] 02/13/2019, 11:47 AM

## 2019-02-13 NOTE — BHH Suicide Risk Assessment (Signed)
[facility] Discharge Suicide Risk Assessment   Principal Problem: Bipolar I disorder, most recent episode depressed ([place]) Discharge Diagnoses: Principal Problem:   Bipolar I disorder, most recent episode depressed ([place])   Total Time spent with patient: 30 minutes  Musculoskeletal: Strength & Muscle Tone: within normal limits Gait & Station: normal Patient leans: N/A  Psychiatric Specialty Exam: Review of Systems  All other systems reviewed and are negative.   Blood pressure (!) 88/50, pulse (!) 59, temperature 97.6 F (36.4 C), temperature source Oral, resp. rate 18, height 5\' 7"  (1.702 m), weight 121.6 kg, SpO2 100 %, unknown if currently breastfeeding.Body mass index is 41.97 kg/m.  General Appearance: Casual  Eye Contact::  Good  Speech:  Normal Rate409  Volume:  Normal  Mood:  Euthymic  Affect:  Congruent  Thought Process:  Coherent and Descriptions of Associations: Intact  Orientation:  Full (Time, Place, and Person)  Thought Content:  Logical  Suicidal Thoughts:  No  Homicidal Thoughts:  No  Memory:  Immediate;   Fair Recent;   Fair Remote;   Fair  Judgement:  Intact  Insight:  Fair  Psychomotor Activity:  Normal  Concentration:  Good  Recall:  [place] of Knowledge:Good  Language: Good  Akathisia:  Negative  Handed:  Right  AIMS (if indicated):     Assets:  Desire for Improvement Housing Resilience Social Support  Sleep:  Number of Hours: 6.75  Cognition: WNL  ADL's:  Intact   Mental Status Per Nursing Assessment::   On Admission:  NA  Demographic Factors:  Adolescent or young adult, Caucasian, Low socioeconomic status and Unemployed  Loss Factors: NA  Historical Factors: Impulsivity  Risk Reduction Factors:   Responsible for children under 60 years of age, Sense of responsibility to family, Living with another person, especially a relative and Positive social support  Continued Clinical Symptoms:  Bipolar Disorder:   Mixed State  Cognitive  Features That Contribute To Risk:  None    Suicide Risk:  Minimal: No identifiable suicidal ideation.  Patients presenting with no risk factors but with morbid ruminations; may be classified as minimal risk based on the severity of the depressive symptoms  Follow-up Information    [facility] Follow up on 02/19/2019.   Why: Telephonic hospital follow up appointment is  Tuesday, 6/23 at 9:00a.  The provider will call you on the day of the appointment.  Contact information: [place] [address] [phone]           Plan Of Care/Follow-up recommendations:  Activity:  ad lib  Sharma Covert, MD 02/13/2019, 2:20 PM

## 2019-02-13 NOTE — Progress Notes (Signed)
Patient ID: Tina Wood, female   DOB: [birth year], 28 y.o.   MRN: [id number] Patient is seen and examined.  Patient is a 28-year-old female seen in follow-up.  The patient is doing much better than yesterday.  She slept well, her mood is much less labile.  She met with child protective services today and laid out a plan.  She is in agreement with that plan is been emotionally appropriate throughout the ordeal today.  She has been compliant with medications and treatment.  She has contacted her mother and father, and they are willing to have her stay with them.  The child will stay with the grandparents, and she will visit with them.  She denied any suicidal or homicidal ideation.  There is no evidence of psychosis or mania currently.  We will go on and process her discharge home today.  Myles Lipps MD

## 2019-02-13 NOTE — Progress Notes (Signed)
D: Patient verbalizes readiness for discharge. Denies suicidal and homicidal ideations. Denies auditory and visual hallucinations.  No complaints of pain.  A:  Patient receptive to discharge instructions. Questions encouraged, both verbalize understanding.  R:  Escorted to the lobby by this RN.

## 2019-02-13 NOTE — Progress Notes (Signed)
Pt affect anxious, mood depressed, cooperative with staff and peers. Pt rated her day a "4" and her goal was to get better. Pt given trazodone for sleep and vistaril. Pt reports that the trazodone is not enough and needs it to be increased. Pt constantly at nursing station. Pt reports that she is wanting to get married to her on/off again boyfriend of three years, even though he is not the father of the baby. Pt denies SI/HI or hallucinations (a) 15 min checks (r) safety maintained.

## 2019-02-13 NOTE — Progress Notes (Signed)
[place] NOVEL CORONAVIRUS (COVID-19) DAILY CHECK-OFF SYMPTOMS - answer yes or no to each - every day NO YES  Have you had a fever in the past 24 hours?  . Fever (Temp > 37.80C / 100F) X   Have you had any of these symptoms in the past 24 hours? . New Cough .  Sore Throat  .  Shortness of Breath .  Difficulty Breathing .  Unexplained Body Aches   X   Have you had any one of these symptoms in the past 24 hours not related to allergies?   . Runny Nose .  Nasal Congestion .  Sneezing   X   If you have had runny nose, nasal congestion, sneezing in the past 24 hours, has it worsened?  X   EXPOSURES - check yes or no X   Have you traveled outside the state in the past 14 days?  X   Have you been in contact with someone with a confirmed diagnosis of COVID-19 or PUI in the past 14 days without wearing appropriate PPE?  X   Have you been living in the same home as a person with confirmed diagnosis of COVID-19 or a PUI (household contact)?    X   Have you been diagnosed with COVID-19?    X              What to do next: Answered NO to all: Answered YES to anything:   Proceed with unit schedule Follow the [facility] Inpatient Flowsheet.   

## 2019-02-13 NOTE — Progress Notes (Signed)
CSW attended meeting with pt and Megan Hedrick and Richard Macon of [place] DSS/Child protective services where pt open case was discussed and safety plan was arranged.  Dr. Clary also met with the them.  Safety plan was arranged and agreement made that pt will reside with her grandparents and her newborn at this time.  , MSW, LCSW Clinical Social Worker 02/13/2019 3:46 PM 

## 2019-02-13 NOTE — Progress Notes (Signed)
[place] NOVEL CORONAVIRUS (COVID-19) DAILY CHECK-OFF SYMPTOMS - answer yes or no to each - every day NO YES  Have you had a fever in the past 24 hours?  . Fever (Temp > 37.80C / 100F) X   Have you had any of these symptoms in the past 24 hours? . New Cough .  Sore Throat  .  Shortness of Breath .  Difficulty Breathing .  Unexplained Body Aches   X   Have you had any one of these symptoms in the past 24 hours not related to allergies?   . Runny Nose .  Nasal Congestion .  Sneezing   X   If you have had runny nose, nasal congestion, sneezing in the past 24 hours, has it worsened?  X   EXPOSURES - check yes or no X   Have you traveled outside the state in the past 14 days?  X   Have you been in contact with someone with a confirmed diagnosis of COVID-19 or PUI in the past 14 days without wearing appropriate PPE?  X   Have you been living in the same home as a person with confirmed diagnosis of COVID-19 or a PUI (household contact)?    X   Have you been diagnosed with COVID-19?    X              What to do next: Answered NO to all: Answered YES to anything:   Proceed with unit schedule Follow the [facility] Inpatient Flowsheet.   

## 2019-02-13 NOTE — Progress Notes (Signed)
  [facility] Adult Case Management Discharge Plan :  Will you be returning to the same living situation after discharge:  No. will be staying with grandparents.  At discharge, do you have transportation home?: Yes,  grandparents Do you have the ability to pay for your medications: Yes,  medicaid  Release of information consent forms completed and in the chart;  Patient's signature needed at discharge.  Patient to Follow up at: Follow-up Information    [facility] Follow up on 02/19/2019.   Why: Telephonic hospital follow up appointment is  Tuesday, 6/23 at 9:00a.  The provider will call you on the day of the appointment.  Contact information: [place] [phone] [phone]           Next level of care provider has access to [place] and Suicide Prevention discussed: Yes,  with ex boyfriend  Have you used any form of tobacco in the last 30 days? (Cigarettes, Smokeless Tobacco, Cigars, and/or Pipes): Yes  Has patient been referred to the Quitline?: Yes, faxed on 02/13/19  Patient has been referred for addiction treatment: N/A  Joanne Chars, LCSW 02/13/2019, 3:42 PM

## 2019-02-25 ENCOUNTER — Ambulatory Visit: Payer: Medicaid Other | Admitting: Obstetrics & Gynecology

## 2019-03-04 MED FILL — Levonorgestrel IUD 20.1 MCG/DAY (Initial) (52 MG Total): INTRAUTERINE | Qty: 1 | Status: AC

## 2019-05-15 DIAGNOSIS — Z765 Malingerer [conscious simulation]: Secondary | ICD-10-CM | POA: Insufficient documentation

## 2019-09-12 ENCOUNTER — Other Ambulatory Visit (HOSPITAL_COMMUNITY)
Admission: RE | Admit: 2019-09-12 | Discharge: 2019-09-12 | Disposition: A | Discharge status: Home / Self Care | Payer: Medicaid Other | Source: Ambulatory Visit | Attending: Obstetrics and Gynecology | Admitting: Obstetrics and Gynecology

## 2019-09-12 ENCOUNTER — Encounter: Payer: Self-pay | Admitting: Obstetrics and Gynecology

## 2019-09-12 ENCOUNTER — Ambulatory Visit (INDEPENDENT_AMBULATORY_CARE_PROVIDER_SITE_OTHER): Payer: Medicaid Other | Admitting: Obstetrics and Gynecology

## 2019-09-12 ENCOUNTER — Other Ambulatory Visit: Payer: Self-pay

## 2019-09-12 VITALS — BP 97/63 | HR 62 | Temp 98.4°F | Wt 282.0 lb

## 2019-09-12 DIAGNOSIS — O0992 Supervision of high risk pregnancy, unspecified, second trimester: Secondary | ICD-10-CM | POA: Diagnosis present

## 2019-09-12 DIAGNOSIS — Z3A09 9 weeks gestation of pregnancy: Secondary | ICD-10-CM

## 2019-09-12 DIAGNOSIS — F319 Bipolar disorder, unspecified: Secondary | ICD-10-CM

## 2019-09-12 DIAGNOSIS — Z87898 Personal history of other specified conditions: Secondary | ICD-10-CM

## 2019-09-12 DIAGNOSIS — O99331 Smoking (tobacco) complicating pregnancy, first trimester: Secondary | ICD-10-CM | POA: Diagnosis not present

## 2019-09-12 DIAGNOSIS — F17200 Nicotine dependence, unspecified, uncomplicated: Secondary | ICD-10-CM

## 2019-09-12 NOTE — Patient Instructions (Signed)
First Trimester of Pregnancy The first trimester of pregnancy is from week 1 until the end of week 13 (months 1 through 3). A week after a sperm fertilizes an egg, the egg will implant on the wall of the uterus. This embryo will begin to develop into a baby. Genes from you and your partner will form the baby. The female genes will determine whether the baby will be a boy or a girl. At 6-8 weeks, the eyes and face will be formed, and the heartbeat can be seen on ultrasound. At the end of 12 weeks, all the baby's organs will be formed. Now that you are pregnant, you will want to do everything you can to have a healthy baby. Two of the most important things are to get good prenatal care and to follow your health care provider's instructions. Prenatal care is all the medical care you receive before the baby's birth. This care will help prevent, find, and treat any problems during the pregnancy and childbirth. Body changes during your first trimester Your body goes through many changes during pregnancy. The changes vary from woman to woman.  You may gain or lose a couple of pounds at first.  You may feel sick to your stomach (nauseous) and you may throw up (vomit). If the vomiting is uncontrollable, call your health care provider.  You may tire easily.  You may develop headaches that can be relieved by medicines. All medicines should be approved by your health care provider.  You may urinate more often. Painful urination may mean you have a bladder infection.  You may develop heartburn as a result of your pregnancy.  You may develop constipation because certain hormones are causing the muscles that push stool through your intestines to slow down.  You may develop hemorrhoids or swollen veins (varicose veins).  Your breasts may begin to grow larger and become tender. Your nipples may stick out more, and the tissue that surrounds them (areola) may become darker.  Your gums may bleed and may be  sensitive to brushing and flossing.  Dark spots or blotches (chloasma, mask of pregnancy) may develop on your face. This will likely fade after the baby is born.  Your menstrual periods will stop.  You may have a loss of appetite.  You may develop cravings for certain kinds of food.  You may have changes in your emotions from day to day, such as being excited to be pregnant or being concerned that something may go wrong with the pregnancy and baby.  You may have more vivid and strange dreams.  You may have changes in your hair. These can include thickening of your hair, rapid growth, and changes in texture. Some women also have hair loss during or after pregnancy, or hair that feels dry or thin. Your hair will most likely return to normal after your baby is born. What to expect at prenatal visits During a routine prenatal visit:  You will be weighed to make sure you and the baby are growing normally.  Your blood pressure will be taken.  Your abdomen will be measured to track your baby's growth.  The fetal heartbeat will be listened to between weeks 10 and 14 of your pregnancy.  Test results from any previous visits will be discussed. Your health care provider may ask you:  How you are feeling.  If you are feeling the baby move.  If you have had any abnormal symptoms, such as leaking fluid, bleeding, severe headaches, or  abdominal cramping.  If you are using any tobacco products, including cigarettes, chewing tobacco, and electronic cigarettes.  If you have any questions. Other tests that may be performed during your first trimester include:  Blood tests to find your blood type and to check for the presence of any previous infections. The tests will also be used to check for low iron levels (anemia) and protein on red blood cells (Rh antibodies). Depending on your risk factors, or if you previously had diabetes during pregnancy, you may have tests to check for high blood sugar  that affects pregnant women (gestational diabetes).  Urine tests to check for infections, diabetes, or protein in the urine.  An ultrasound to confirm the proper growth and development of the baby.  Fetal screens for spinal cord problems (spina bifida) and Down syndrome.  HIV (human immunodeficiency virus) testing. Routine prenatal testing includes screening for HIV, unless you choose not to have this test.  You may need other tests to make sure you and the baby are doing well. Follow these instructions at home: Medicines  Follow your health care provider's instructions regarding medicine use. Specific medicines may be either safe or unsafe to take during pregnancy.  Take a prenatal vitamin that contains at least 600 micrograms (mcg) of folic acid.  If you develop constipation, try taking a stool softener if your health care provider approves. Eating and drinking   Eat a balanced diet that includes fresh fruits and vegetables, whole grains, good sources of protein such as meat, eggs, or tofu, and low-fat dairy. Your health care provider will help you determine the amount of weight gain that is right for you.  Avoid raw meat and uncooked cheese. These carry germs that can cause birth defects in the baby.  Eating four or five small meals rather than three large meals a day may help relieve nausea and vomiting. If you start to feel nauseous, eating a few soda crackers can be helpful. Drinking liquids between meals, instead of during meals, also seems to help ease nausea and vomiting.  Limit foods that are high in fat and processed sugars, such as fried and sweet foods.  To prevent constipation: ? Eat foods that are high in fiber, such as fresh fruits and vegetables, whole grains, and beans. ? Drink enough fluid to keep your urine clear or pale yellow. Activity  Exercise only as directed by your health care provider. Most women can continue their usual exercise routine during  pregnancy. Try to exercise for 30 minutes at least 5 days a week. Exercising will help you: ? Control your weight. ? Stay in shape. ? Be prepared for labor and delivery.  Experiencing pain or cramping in the lower abdomen or lower back is a good sign that you should stop exercising. Check with your health care provider before continuing with normal exercises.  Try to avoid standing for long periods of time. Move your legs often if you must stand in one place for a long time.  Avoid heavy lifting.  Wear low-heeled shoes and practice good posture.  You may continue to have sex unless your health care provider tells you not to. Relieving pain and discomfort  Wear a good support bra to relieve breast tenderness.  Take warm sitz baths to soothe any pain or discomfort caused by hemorrhoids. Use hemorrhoid cream if your health care provider approves.  Rest with your legs elevated if you have leg cramps or low back pain.  If you develop varicose veins   in your legs, wear support hose. Elevate your feet for 15 minutes, 3-4 times a day. Limit salt in your diet. Prenatal care  Schedule your prenatal visits by the twelfth week of pregnancy. They are usually scheduled monthly at first, then more often in the last 2 months before delivery.  Write down your questions. Take them to your prenatal visits.  Keep all your prenatal visits as told by your health care provider. This is important. Safety  Wear your seat belt at all times when driving.  Make a list of emergency phone numbers, including numbers for family, friends, the hospital, and police and fire departments. General instructions  Ask your health care provider for a referral to a local prenatal education class. Begin classes no later than the beginning of month 6 of your pregnancy.  Ask for help if you have counseling or nutritional needs during pregnancy. Your health care provider can offer advice or refer you to specialists for help  with various needs.  Do not use hot tubs, steam rooms, or saunas.  Do not douche or use tampons or scented sanitary pads.  Do not cross your legs for long periods of time.  Avoid cat litter boxes and soil used by cats. These carry germs that can cause birth defects in the baby and possibly loss of the fetus by miscarriage or stillbirth.  Avoid all smoking, herbs, alcohol, and medicines not prescribed by your health care provider. Chemicals in these products affect the formation and growth of the baby.  Do not use any products that contain nicotine or tobacco, such as cigarettes and e-cigarettes. If you need help quitting, ask your health care provider. You may receive counseling support and other resources to help you quit.  Schedule a dentist appointment. At home, brush your teeth with a soft toothbrush and be gentle when you floss. Contact a health care provider if:  You have dizziness.  You have mild pelvic cramps, pelvic pressure, or nagging pain in the abdominal area.  You have persistent nausea, vomiting, or diarrhea.  You have a bad smelling vaginal discharge.  You have pain when you urinate.  You notice increased swelling in your face, hands, legs, or ankles.  You are exposed to fifth disease or chickenpox.  You are exposed to [place] measles (rubella) and have never had it. Get help right away if:  You have a fever.  You are leaking fluid from your vagina.  You have spotting or bleeding from your vagina.  You have severe abdominal cramping or pain.  You have rapid weight gain or loss.  You vomit blood or material that looks like coffee grounds.  You develop a severe headache.  You have shortness of breath.  You have any kind of trauma, such as from a fall or a car accident. Summary  The first trimester of pregnancy is from week 1 until the end of week 13 (months 1 through 3).  Your body goes through many changes during pregnancy. The changes vary from  woman to woman.  You will have routine prenatal visits. During those visits, your health care provider will examine you, discuss any test results you may have, and talk with you about how you are feeling. This information is not intended to replace advice given to you by your health care provider. Make sure you discuss any questions you have with your health care provider. Document Revised: 07/28/2017 Document Reviewed: 07/27/2016 Elsevier Patient Education  [place] of  Pregnancy The second trimester is from week 14 through week 27 (months 4 through 6). The second trimester is often a time when you feel your best. Your body has adjusted to being pregnant, and you begin to feel better physically. Usually, morning sickness has lessened or quit completely, you may have more energy, and you may have an increase in appetite. The second trimester is also a time when the fetus is growing rapidly. At the end of the sixth month, the fetus is about 9 inches long and weighs about 1 pounds. You will likely begin to feel the baby move (quickening) between 16 and 20 weeks of pregnancy. Body changes during your second trimester Your body continues to go through many changes during your second trimester. The changes vary from woman to woman.  Your weight will continue to increase. You will notice your lower abdomen bulging out.  You may begin to get stretch marks on your hips, abdomen, and breasts.  You may develop headaches that can be relieved by medicines. The medicines should be approved by your health care provider.  You may urinate more often because the fetus is pressing on your bladder.  You may develop or continue to have heartburn as a result of your pregnancy.  You may develop constipation because certain hormones are causing the muscles that push waste through your intestines to slow down.  You may develop hemorrhoids or swollen, bulging veins (varicose  veins).  You may have back pain. This is caused by: ? Weight gain. ? Pregnancy hormones that are relaxing the joints in your pelvis. ? A shift in weight and the muscles that support your balance.  Your breasts will continue to grow and they will continue to become tender.  Your gums may bleed and may be sensitive to brushing and flossing.  Dark spots or blotches (chloasma, mask of pregnancy) may develop on your face. This will likely fade after the baby is born.  A dark line from your belly button to the pubic area (linea nigra) may appear. This will likely fade after the baby is born.  You may have changes in your hair. These can include thickening of your hair, rapid growth, and changes in texture. Some women also have hair loss during or after pregnancy, or hair that feels dry or thin. Your hair will most likely return to normal after your baby is born. What to expect at prenatal visits During a routine prenatal visit:  You will be weighed to make sure you and the fetus are growing normally.  Your blood pressure will be taken.  Your abdomen will be measured to track your baby's growth.  The fetal heartbeat will be listened to.  Any test results from the previous visit will be discussed. Your health care provider may ask you:  How you are feeling.  If you are feeling the baby move.  If you have had any abnormal symptoms, such as leaking fluid, bleeding, severe headaches, or abdominal cramping.  If you are using any tobacco products, including cigarettes, chewing tobacco, and electronic cigarettes.  If you have any questions. Other tests that may be performed during your second trimester include:  Blood tests that check for: ? Low iron levels (anemia). ? High blood sugar that affects pregnant women (gestational diabetes) between 81 and 28 weeks. ? Rh antibodies. This is to check for a protein on red blood cells (Rh factor).  Urine tests to check for infections,  diabetes, or protein in the urine.  An ultrasound to confirm the proper growth and development of the baby.  An amniocentesis to check for possible genetic problems.  Fetal screens for spina bifida and Down syndrome.  HIV (human immunodeficiency virus) testing. Routine prenatal testing includes screening for HIV, unless you choose not to have this test. Follow these instructions at home: Medicines  Follow your health care provider's instructions regarding medicine use. Specific medicines may be either safe or unsafe to take during pregnancy.  Take a prenatal vitamin that contains at least 600 micrograms (mcg) of folic acid.  If you develop constipation, try taking a stool softener if your health care provider approves. Eating and drinking   Eat a balanced diet that includes fresh fruits and vegetables, whole grains, good sources of protein such as meat, eggs, or tofu, and low-fat dairy. Your health care provider will help you determine the amount of weight gain that is right for you.  Avoid raw meat and uncooked cheese. These carry germs that can cause birth defects in the baby.  If you have low calcium intake from food, talk to your health care provider about whether you should take a daily calcium supplement.  Limit foods that are high in fat and processed sugars, such as fried and sweet foods.  To prevent constipation: ? Drink enough fluid to keep your urine clear or pale yellow. ? Eat foods that are high in fiber, such as fresh fruits and vegetables, whole grains, and beans. Activity  Exercise only as directed by your health care provider. Most women can continue their usual exercise routine during pregnancy. Try to exercise for 30 minutes at least 5 days a week. Stop exercising if you experience uterine contractions.  Avoid heavy lifting, wear low heel shoes, and practice good posture.  A sexual relationship may be continued unless your health care provider directs you  otherwise. Relieving pain and discomfort  Wear a good support bra to prevent discomfort from breast tenderness.  Take warm sitz baths to soothe any pain or discomfort caused by hemorrhoids. Use hemorrhoid cream if your health care provider approves.  Rest with your legs elevated if you have leg cramps or low back pain.  If you develop varicose veins, wear support hose. Elevate your feet for 15 minutes, 3-4 times a day. Limit salt in your diet. Prenatal Care  Write down your questions. Take them to your prenatal visits.  Keep all your prenatal visits as told by your health care provider. This is important. Safety  Wear your seat belt at all times when driving.  Make a list of emergency phone numbers, including numbers for family, friends, the hospital, and police and fire departments. General instructions  Ask your health care provider for a referral to a local prenatal education class. Begin classes no later than the beginning of month 6 of your pregnancy.  Ask for help if you have counseling or nutritional needs during pregnancy. Your health care provider can offer advice or refer you to specialists for help with various needs.  Do not use hot tubs, steam rooms, or saunas.  Do not douche or use tampons or scented sanitary pads.  Do not cross your legs for long periods of time.  Avoid cat litter boxes and soil used by cats. These carry germs that can cause birth defects in the baby and possibly loss of the fetus by miscarriage or stillbirth.  Avoid all smoking, herbs, alcohol, and unprescribed drugs. Chemicals in these products can affect the formation and growth of  the baby.  Do not use any products that contain nicotine or tobacco, such as cigarettes and e-cigarettes. If you need help quitting, ask your health care provider.  Visit your dentist if you have not gone yet during your pregnancy. Use a soft toothbrush to brush your teeth and be gentle when you floss. Contact a  health care provider if:  You have dizziness.  You have mild pelvic cramps, pelvic pressure, or nagging pain in the abdominal area.  You have persistent nausea, vomiting, or diarrhea.  You have a bad smelling vaginal discharge.  You have pain when you urinate. Get help right away if:  You have a fever.  You are leaking fluid from your vagina.  You have spotting or bleeding from your vagina.  You have severe abdominal cramping or pain.  You have rapid weight gain or weight loss.  You have shortness of breath with chest pain.  You notice sudden or extreme swelling of your face, hands, ankles, feet, or legs.  You have not felt your baby move in over an hour.  You have severe headaches that do not go away when you take medicine.  You have vision changes. Summary  The second trimester is from week 14 through week 27 (months 4 through 6). It is also a time when the fetus is growing rapidly.  Your body goes through many changes during pregnancy. The changes vary from woman to woman.  Avoid all smoking, herbs, alcohol, and unprescribed drugs. These chemicals affect the formation and growth your baby.  Do not use any tobacco products, such as cigarettes, chewing tobacco, and e-cigarettes. If you need help quitting, ask your health care provider.  Contact your health care provider if you have any questions. Keep all prenatal visits as told by your health care provider. This is important. This information is not intended to replace advice given to you by your health care provider. Make sure you discuss any questions you have with your health care provider. Document Revised: 12/07/2018 Document Reviewed: 09/20/2016 Elsevier Patient Education  [place].   Pregnancy and Smoking Smoking during pregnancy is unhealthy for you and your baby. Smoke from cigarettes, e-cigarettes, pipes, and cigars contains many chemicals that can cause cancer (carcinogens). These products also  contain a stimulant drug (nicotine). When you smoke, harmful substances that you breathe in enter your bloodstream and can be passed on to your baby. This can affect your baby's development. If you are planning to become pregnant or have recently become pregnant, talk with your health care provider about quitting smoking. You have a much better chance of having a healthy pregnancy and a healthy baby if you do not smoke while you are pregnant. How does smoking affect me? Smoking increases your risk for many long-term (chronic) diseases. These diseases include cancer, lung diseases, and heart disease. Smoking during pregnancy increases your risk of:  Losing the pregnancy (miscarriage or stillbirth).  Giving birth too early (premature birth).  Pregnancy outside of the uterus (tubal pregnancy).  Problems with the placenta, which is the organ that provides the baby nourishment and oxygen. These problems may include: ? Attachment of the placenta over the opening of the uterus (placenta previa). ? Detachment of the placenta before the baby's birth (placental abruption).  Having your water break before labor begins. How does smoking affect my baby? Before birth Smoking during pregnancy:  Decreases blood flow and oxygen to your baby.  Increases your baby's risk of birth defects, such as heart  defects.  Increases your baby's heart rate.  Slows your baby's growth in the uterus (intrauterine growth retardation). After birth Babies born to women who smoked during pregnancy may:  Have symptoms of nicotine withdrawal.  Be born with a cleft lip, cleft palate, or other facial deformities.  Be too small at birth.  Have a high risk of: ? Serious health problems or lifelong disabilities. These may result in the long-term need for certain medicines, therapies, or other treatments. ? Sudden infant death syndrome (SIDS). Follow these instructions at home:   Do not use any products that contain  nicotine or tobacco, such as cigarettes, e-cigarettes, and chewing tobacco. If you need help quitting, ask your health care provider.  Talk with your health care provider about support strategies to quit smoking. Some methods to consider include: ? Counseling (smoking cessation counseling). ? Psychotherapy. ? Acupuncture. ? Hypnosis. ? Telephone hotlines for people trying to quit.  Do not take nicotine supplements or medicine to help you quit smoking unless your health care provider tells you to do so.  Avoid secondhand smoke. Ask people who smoke to avoid smoking around you.  Identify people, places, things, and activities that make you want to smoke (triggers). Avoid them. Where to find more information Learn more about smoking during pregnancy and quitting smoking from:  March of Dimes: www.marchofdimes.org  U.S. Department of Health and Human Services: women.smokefree.gov  American Cancer Society: www.cancer.org  American Heart Association: www.heart.[place]: www.cancer.gov For help to quit smoking:  National smoking cessation telephone hotline: 1-800-QUIT NOW 437 324 6535) Contact a health care provider if:  You are struggling to quit smoking.  You are a smoker and you become pregnant or plan to become pregnant.  You start smoking again after giving birth. Summary  Smoking during pregnancy is unhealthy for you and your baby.  Tobacco smoke contains harmful substances that can affect a baby's health and development.  Smoking increases the risk for serious problems, such as miscarriage, birth defects, or premature birth.  If you need help to quit smoking, talk to your health care provider and ask about support strategies such as counseling. This information is not intended to replace advice given to you by your health care provider. Make sure you discuss any questions you have with your health care provider. Document Revised: 03/15/2019 Document  Reviewed: 03/15/2019 Elsevier Patient Education  2020 [employer].   Contraception Choices Contraception, also called birth control, refers to methods or devices that prevent pregnancy. Hormonal methods Contraceptive implant  A contraceptive implant is a thin, plastic tube that contains a hormone. It is inserted into the upper part of the arm. It can remain in place for up to 3 years. Progestin-only injections Progestin-only injections are injections of progestin, a synthetic form of the hormone progesterone. They are given every 3 months by a health care provider. Birth control pills  Birth control pills are pills that contain hormones that prevent pregnancy. They must be taken once a day, preferably at the same time each day. Birth control patch  The birth control patch contains hormones that prevent pregnancy. It is placed on the skin and must be changed once a week for three weeks and removed on the fourth week. A prescription is needed to use this method of contraception. Vaginal ring  A vaginal ring contains hormones that prevent pregnancy. It is placed in the vagina for three weeks and removed on the fourth week. After that, the process is repeated with a new  ring. A prescription is needed to use this method of contraception. Emergency contraceptive Emergency contraceptives prevent pregnancy after unprotected sex. They come in pill form and can be taken up to 5 days after sex. They work best the sooner they are taken after having sex. Most emergency contraceptives are available without a prescription. This method should not be used as your only form of birth control. Barrier methods Female condom  A female condom is a thin sheath that is worn over the penis during sex. Condoms keep sperm from going inside a woman's body. They can be used with a spermicide to increase their effectiveness. They should be disposed after a single use. Female condom  A female condom is a soft,  loose-fitting sheath that is put into the vagina before sex. The condom keeps sperm from going inside a woman's body. They should be disposed after a single use. Diaphragm  A diaphragm is a soft, dome-shaped barrier. It is inserted into the vagina before sex, along with a spermicide. The diaphragm blocks sperm from entering the uterus, and the spermicide kills sperm. A diaphragm should be left in the vagina for 6-8 hours after sex and removed within 24 hours. A diaphragm is prescribed and fitted by a health care provider. A diaphragm should be replaced every 1-2 years, after giving birth, after gaining more than 15 lb (6.8 kg), and after pelvic surgery. Cervical cap  A cervical cap is a round, soft latex or plastic cup that fits over the cervix. It is inserted into the vagina before sex, along with spermicide. It blocks sperm from entering the uterus. The cap should be left in place for 6-8 hours after sex and removed within 48 hours. A cervical cap must be prescribed and fitted by a health care provider. It should be replaced every 2 years. Sponge  A sponge is a soft, circular piece of polyurethane foam with spermicide on it. The sponge helps block sperm from entering the uterus, and the spermicide kills sperm. To use it, you make it wet and then insert it into the vagina. It should be inserted before sex, left in for at least 6 hours after sex, and removed and thrown away within 30 hours. Spermicides Spermicides are chemicals that kill or block sperm from entering the cervix and uterus. They can come as a cream, jelly, suppository, foam, or tablet. A spermicide should be inserted into the vagina with an applicator at least [id number] minutes before sex to allow time for it to work. The process must be repeated every time you have sex. Spermicides do not require a prescription. Intrauterine contraception Intrauterine device (IUD) An IUD is a T-shaped device that is put in a woman's uterus. There are two  types:  Hormone IUD.This type contains progestin, a synthetic form of the hormone progesterone. This type can stay in place for 3-5 years.  Copper IUD.This type is wrapped in copper wire. It can stay in place for 10 years.  Permanent methods of contraception Female tubal ligation In this method, a woman's fallopian tubes are sealed, tied, or blocked during surgery to prevent eggs from traveling to the uterus. Hysteroscopic sterilization In this method, a small, flexible insert is placed into each fallopian tube. The inserts cause scar tissue to form in the fallopian tubes and block them, so sperm cannot reach an egg. The procedure takes about 3 months to be effective. Another form of birth control must be used during those 3 months. Female sterilization This is a procedure  to tie off the tubes that carry sperm (vasectomy). After the procedure, the man can still ejaculate fluid (semen). Natural planning methods Natural family planning In this method, a couple does not have sex on days when the woman could become pregnant. Calendar method This means keeping track of the length of each menstrual cycle, identifying the days when pregnancy can happen, and not having sex on those days. Ovulation method In this method, a couple avoids sex during ovulation. Symptothermal method This method involves not having sex during ovulation. The woman typically checks for ovulation by watching changes in her temperature and in the consistency of cervical mucus. Post-ovulation method In this method, a couple waits to have sex until after ovulation. Summary  Contraception, also called birth control, means methods or devices that prevent pregnancy.  Hormonal methods of contraception include implants, injections, pills, patches, vaginal rings, and emergency contraceptives.  Barrier methods of contraception can include female condoms, female condoms, diaphragms, cervical caps, sponges, and spermicides.  There  are two types of IUDs (intrauterine devices). An IUD can be put in a woman's uterus to prevent pregnancy for 3-5 years.  Permanent sterilization can be done through a procedure for males, females, or both.  Natural family planning methods involve not having sex on days when the woman could become pregnant. This information is not intended to replace advice given to you by your health care provider. Make sure you discuss any questions you have with your health care provider. Document Revised: 08/17/2017 Document Reviewed: 09/17/2016 Elsevier Patient Education  2020 [employer].   Breastfeeding  Choosing to breastfeed is one of the best decisions you can make for yourself and your baby. A change in hormones during pregnancy causes your breasts to make breast milk in your milk-producing glands. Hormones prevent breast milk from being released before your baby is born. They also prompt milk flow after birth. Once breastfeeding has begun, thoughts of your baby, as well as his or her sucking or crying, can stimulate the release of milk from your milk-producing glands. Benefits of breastfeeding Research shows that breastfeeding offers many health benefits for infants and mothers. It also offers a cost-free and convenient way to feed your baby. For your baby  Your first milk (colostrum) helps your baby's digestive system to function better.  Special cells in your milk (antibodies) help your baby to fight off infections.  Breastfed babies are less likely to develop asthma, allergies, obesity, or type 2 diabetes. They are also at lower risk for sudden infant death syndrome (SIDS).  Nutrients in breast milk are better able to meet your baby's needs compared to infant formula.  Breast milk improves your baby's brain development. For you  Breastfeeding helps to create a very special bond between you and your baby.  Breastfeeding is convenient. Breast milk costs nothing and is always available at  the correct temperature.  Breastfeeding helps to burn calories. It helps you to lose the weight that you gained during pregnancy.  Breastfeeding makes your uterus return faster to its size before pregnancy. It also slows bleeding (lochia) after you give birth.  Breastfeeding helps to lower your risk of developing type 2 diabetes, osteoporosis, rheumatoid arthritis, cardiovascular disease, and breast, ovarian, uterine, and endometrial cancer later in life. Breastfeeding basics Starting breastfeeding  Find a comfortable place to sit or lie down, with your neck and back well-supported.  Place a pillow or a rolled-up blanket under your baby to bring him or her to the level of your  breast (if you are seated). Nursing pillows are specially designed to help support your arms and your baby while you breastfeed.  Make sure that your baby's tummy (abdomen) is facing your abdomen.  Gently massage your breast. With your fingertips, massage from the outer edges of your breast inward toward the nipple. This encourages milk flow. If your milk flows slowly, you may need to continue this action during the feeding.  Support your breast with 4 fingers underneath and your thumb above your nipple (make the letter "C" with your hand). Make sure your fingers are well away from your nipple and your baby's mouth.  Stroke your baby's lips gently with your finger or nipple.  When your baby's mouth is open wide enough, quickly bring your baby to your breast, placing your entire nipple and as much of the areola as possible into your baby's mouth. The areola is the colored area around your nipple. ? More areola should be visible above your baby's upper lip than below the lower lip. ? Your baby's lips should be opened and extended outward (flanged) to ensure an adequate, comfortable latch. ? Your baby's tongue should be between his or her lower gum and your breast.  Make sure that your baby's mouth is correctly  positioned around your nipple (latched). Your baby's lips should create a seal on your breast and be turned out (everted).  It is common for your baby to suck about 2-3 minutes in order to start the flow of breast milk. Latching Teaching your baby how to latch onto your breast properly is very important. An improper latch can cause nipple pain, decreased milk supply, and poor weight gain in your baby. Also, if your baby is not latched onto your nipple properly, he or she may swallow some air during feeding. This can make your baby fussy. Burping your baby when you switch breasts during the feeding can help to get rid of the air. However, teaching your baby to latch on properly is still the best way to prevent fussiness from swallowing air while breastfeeding. Signs that your baby has successfully latched onto your nipple  Silent tugging or silent sucking, without causing you pain. Infant's lips should be extended outward (flanged).  Swallowing heard between every 3-4 sucks once your milk has started to flow (after your let-down milk reflex occurs).  Muscle movement above and in front of his or her ears while sucking. Signs that your baby has not successfully latched onto your nipple  Sucking sounds or smacking sounds from your baby while breastfeeding.  Nipple pain. If you think your baby has not latched on correctly, slip your finger into the corner of your baby's mouth to break the suction and place it between your baby's gums. Attempt to start breastfeeding again. Signs of successful breastfeeding Signs from your baby  Your baby will gradually decrease the number of sucks or will completely stop sucking.  Your baby will fall asleep.  Your baby's body will relax.  Your baby will retain a small amount of milk in his or her mouth.  Your baby will let go of your breast by himself or herself. Signs from you  Breasts that have increased in firmness, weight, and size 1-3 hours after  feeding.  Breasts that are softer immediately after breastfeeding.  Increased milk volume, as well as a change in milk consistency and color by the fifth day of breastfeeding.  Nipples that are not sore, cracked, or bleeding. Signs that your baby is getting  enough milk  Wetting at least 1-2 diapers during the first 24 hours after birth.  Wetting at least 5-6 diapers every 24 hours for the first week after birth. The urine should be clear or pale yellow by the age of 5 days.  Wetting 6-8 diapers every 24 hours as your baby continues to grow and develop.  At least 3 stools in a 24-hour period by the age of 5 days. The stool should be soft and yellow.  At least 3 stools in a 24-hour period by the age of 7 days. The stool should be seedy and yellow.  No loss of weight greater than 10% of birth weight during the first 3 days of life.  Average weight gain of 4-7 oz (113-198 g) per week after the age of 4 days.  Consistent daily weight gain by the age of 5 days, without weight loss after the age of 2 weeks. After a feeding, your baby may spit up a small amount of milk. This is normal. Breastfeeding frequency and duration Frequent feeding will help you make more milk and can prevent sore nipples and extremely full breasts (breast engorgement). Breastfeed when you feel the need to reduce the fullness of your breasts or when your baby shows signs of hunger. This is called "breastfeeding on demand." Signs that your baby is hungry include:  Increased alertness, activity, or restlessness.  Movement of the head from side to side.  Opening of the mouth when the corner of the mouth or cheek is stroked (rooting).  Increased sucking sounds, smacking lips, cooing, sighing, or squeaking.  Hand-to-mouth movements and sucking on fingers or hands.  Fussing or crying. Avoid introducing a pacifier to your baby in the first 4-6 weeks after your baby is born. After this time, you may choose to use a  pacifier. Research has shown that pacifier use during the first year of a baby's life decreases the risk of sudden infant death syndrome (SIDS). Allow your baby to feed on each breast as long as he or she wants. When your baby unlatches or falls asleep while feeding from the first breast, offer the second breast. Because newborns are often sleepy in the first few weeks of life, you may need to awaken your baby to get him or her to feed. Breastfeeding times will vary from baby to baby. However, the following rules can serve as a guide to help you make sure that your baby is properly fed:  Newborns (babies 71 weeks of age or younger) may breastfeed every 1-3 hours.  Newborns should not go without breastfeeding for longer than 3 hours during the day or 5 hours during the night.  You should breastfeed your baby a minimum of 8 times in a 24-hour period. Breast milk pumping     Pumping and storing breast milk allows you to make sure that your baby is exclusively fed your breast milk, even at times when you are unable to breastfeed. This is especially important if you go back to work while you are still breastfeeding, or if you are not able to be present during feedings. Your lactation consultant can help you find a method of pumping that works best for you and give you guidelines about how long it is safe to store breast milk. Caring for your breasts while you breastfeed Nipples can become dry, cracked, and sore while breastfeeding. The following recommendations can help keep your breasts moisturized and healthy:  Avoid using soap on your nipples.  Wear a  supportive bra designed especially for nursing. Avoid wearing underwire-style bras or extremely tight bras (sports bras).  Air-dry your nipples for 3-4 minutes after each feeding.  Use only cotton bra pads to absorb leaked breast milk. Leaking of breast milk between feedings is normal.  Use lanolin on your nipples after breastfeeding. Lanolin  helps to maintain your skin's normal moisture barrier. Pure lanolin is not harmful (not toxic) to your baby. You may also hand express a few drops of breast milk and gently massage that milk into your nipples and allow the milk to air-dry. In the first few weeks after giving birth, some women experience breast engorgement. Engorgement can make your breasts feel heavy, warm, and tender to the touch. Engorgement peaks within 3-5 days after you give birth. The following recommendations can help to ease engorgement:  Completely empty your breasts while breastfeeding or pumping. You may want to start by applying warm, moist heat (in the shower or with warm, water-soaked hand towels) just before feeding or pumping. This increases circulation and helps the milk flow. If your baby does not completely empty your breasts while breastfeeding, pump any extra milk after he or she is finished.  Apply ice packs to your breasts immediately after breastfeeding or pumping, unless this is too uncomfortable for you. To do this: ? Put ice in a plastic bag. ? Place a towel between your skin and the bag. ? Leave the ice on for 20 minutes, 2-3 times a day.  Make sure that your baby is latched on and positioned properly while breastfeeding. If engorgement persists after 48 hours of following these recommendations, contact your health care provider or a Science writer. Overall health care recommendations while breastfeeding  Eat 3 healthy meals and 3 snacks every day. Well-nourished mothers who are breastfeeding need an additional 450-500 calories a day. You can meet this requirement by increasing the amount of a balanced diet that you eat.  Drink enough water to keep your urine pale yellow or clear.  Rest often, relax, and continue to take your prenatal vitamins to prevent fatigue, stress, and low vitamin and mineral levels in your body (nutrient deficiencies).  Do not use any products that contain nicotine or  tobacco, such as cigarettes and e-cigarettes. Your baby may be harmed by chemicals from cigarettes that pass into breast milk and exposure to secondhand smoke. If you need help quitting, ask your health care provider.  Avoid alcohol.  Do not use illegal drugs or marijuana.  Talk with your health care provider before taking any medicines. These include over-the-counter and prescription medicines as well as vitamins and herbal supplements. Some medicines that may be harmful to your baby can pass through breast milk.  It is possible to become pregnant while breastfeeding. If birth control is desired, ask your health care provider about options that will be safe while breastfeeding your baby. Where to find more information: [employer]: www.llli.org Contact a health care provider if:  You feel like you want to stop breastfeeding or have become frustrated with breastfeeding.  Your nipples are cracked or bleeding.  Your breasts are red, tender, or warm.  You have: ? Painful breasts or nipples. ? A swollen area on either breast. ? A fever or chills. ? Nausea or vomiting. ? Drainage other than breast milk from your nipples.  Your breasts do not become full before feedings by the fifth day after you give birth.  You feel sad and depressed.  Your baby is: ?  Too sleepy to eat well. ? Having trouble sleeping. ? More than 55 week old and wetting fewer than 6 diapers in a 24-hour period. ? Not gaining weight by 94 days of age.  Your baby has fewer than 3 stools in a 24-hour period.  Your baby's skin or the white parts of his or her eyes become yellow. Get help right away if:  Your baby is overly tired (lethargic) and does not want to wake up and feed.  Your baby develops an unexplained fever. Summary  Breastfeeding offers many health benefits for infant and mothers.  Try to breastfeed your infant when he or she shows early signs of hunger.  Gently tickle or stroke  your baby's lips with your finger or nipple to allow the baby to open his or her mouth. Bring the baby to your breast. Make sure that much of the areola is in your baby's mouth. Offer one side and burp the baby before you offer the other side.  Talk with your health care provider or lactation consultant if you have questions or you face problems as you breastfeed. This information is not intended to replace advice given to you by your health care provider. Make sure you discuss any questions you have with your health care provider. Document Revised: 11/09/2017 Document Reviewed: 09/16/2016 Elsevier Patient Education  [employer].

## 2019-09-12 NOTE — Progress Notes (Signed)
Pt states she has had increased cramping and pain since 09/06/19
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
## 2019-09-12 NOTE — Progress Notes (Signed)
Bedside U/S shows single IUP with FHT of 150 BPM and CRL measures 26.52mm  GA is [redacted]w[redacted]d which is consistant with what pt had said her last scan showed

## 2019-09-13 LAB — OBSTETRIC PANEL
Absolute Monocytes: 485 cells/uL (ref 200–950)
Antibody Screen: NOT DETECTED
Basophils Absolute: 69 cells/uL (ref 0–200)
Basophils Relative: 0.7 %
Eosinophils Absolute: 50 cells/uL (ref 15–500)
Eosinophils Relative: 0.5 %
HCT: 38.2 % (ref 35.0–45.0)
Hemoglobin: 12.5 g/dL (ref 11.7–15.5)
Hepatitis B Surface Ag: NONREACTIVE
Lymphs Abs: 2703 cells/uL (ref 850–3900)
MCH: 26 pg — ABNORMAL LOW (ref 27.0–33.0)
MCHC: 32.7 g/dL (ref 32.0–36.0)
MCV: 79.6 fL — ABNORMAL LOW (ref 80.0–100.0)
MPV: 11.5 fL (ref 7.5–12.5)
Monocytes Relative: 4.9 %
Neutro Abs: 6593 cells/uL (ref 1500–7800)
Neutrophils Relative %: 66.6 %
Platelets: 318 10*3/uL (ref 140–400)
RBC: 4.8 10*6/uL (ref 3.80–5.10)
RDW: 14.2 % (ref 11.0–15.0)
RPR Ser Ql: NONREACTIVE
Rubella: 1.3 Index
Total Lymphocyte: 27.3 %
WBC: 9.9 10*3/uL (ref 3.8–10.8)

## 2019-09-13 LAB — HEMOGLOBIN A1C
Hgb A1c MFr Bld: 5.1 % of total Hgb (ref <5.7)
Mean Plasma Glucose: 100 (calc)
eAG (mmol/L): 5.5 (calc)

## 2019-09-13 LAB — HIV ANTIBODY (ROUTINE TESTING W REFLEX): HIV 1&2 Ab, 4th Generation: NONREACTIVE

## 2019-09-15 LAB — URINE CULTURE, OB REFLEX

## 2019-09-15 LAB — CULTURE, OB URINE

## 2019-09-16 LAB — GC/CHLAMYDIA PROBE AMP (~~LOC~~) NOT AT ARMC
Chlamydia: NEGATIVE
Comment: NEGATIVE
Comment: NORMAL
Neisseria Gonorrhea: NEGATIVE

## 2019-10-03 ENCOUNTER — Ambulatory Visit: Payer: Medicaid Other | Admitting: *Deleted

## 2019-10-07 ENCOUNTER — Encounter: Payer: Medicaid Other | Admitting: Obstetrics & Gynecology

## 2019-10-08 ENCOUNTER — Ambulatory Visit (INDEPENDENT_AMBULATORY_CARE_PROVIDER_SITE_OTHER): Payer: Medicaid Other | Admitting: Advanced Practice Midwife

## 2019-10-08 ENCOUNTER — Other Ambulatory Visit: Payer: Self-pay

## 2019-10-08 VITALS — BP 108/67 | HR 69 | Temp 98.4°F | Wt 282.0 lb

## 2019-10-08 DIAGNOSIS — O0992 Supervision of high risk pregnancy, unspecified, second trimester: Secondary | ICD-10-CM

## 2019-10-08 DIAGNOSIS — F319 Bipolar disorder, unspecified: Secondary | ICD-10-CM

## 2019-10-08 DIAGNOSIS — O0991 Supervision of high risk pregnancy, unspecified, first trimester: Secondary | ICD-10-CM

## 2019-10-08 DIAGNOSIS — Z3A13 13 weeks gestation of pregnancy: Secondary | ICD-10-CM

## 2019-10-08 DIAGNOSIS — Z87898 Personal history of other specified conditions: Secondary | ICD-10-CM

## 2019-10-08 DIAGNOSIS — Z659 Problem related to unspecified psychosocial circumstances: Secondary | ICD-10-CM
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
## 2019-10-10 ENCOUNTER — Telehealth: Payer: Medicaid Other | Admitting: Obstetrics and Gynecology

## 2019-10-21 ENCOUNTER — Encounter: Payer: Self-pay | Admitting: *Deleted

## 2019-11-05 ENCOUNTER — Other Ambulatory Visit (HOSPITAL_COMMUNITY)
Admission: RE | Admit: 2019-11-05 | Discharge: 2019-11-05 | Disposition: A | Discharge status: Home / Self Care | Payer: Medicaid Other | Source: Ambulatory Visit | Attending: Advanced Practice Midwife | Admitting: Advanced Practice Midwife

## 2019-11-05 ENCOUNTER — Other Ambulatory Visit: Payer: Self-pay

## 2019-11-05 ENCOUNTER — Ambulatory Visit (INDEPENDENT_AMBULATORY_CARE_PROVIDER_SITE_OTHER): Payer: Medicaid Other | Admitting: Advanced Practice Midwife

## 2019-11-05 VITALS — BP 128/81 | HR 100 | Wt 281.0 lb

## 2019-11-05 DIAGNOSIS — O99342 Other mental disorders complicating pregnancy, second trimester: Secondary | ICD-10-CM

## 2019-11-05 DIAGNOSIS — N898 Other specified noninflammatory disorders of vagina: Secondary | ICD-10-CM | POA: Diagnosis present

## 2019-11-05 DIAGNOSIS — O0992 Supervision of high risk pregnancy, unspecified, second trimester: Secondary | ICD-10-CM

## 2019-11-05 DIAGNOSIS — F419 Anxiety disorder, unspecified: Secondary | ICD-10-CM

## 2019-11-05 DIAGNOSIS — O99340 Other mental disorders complicating pregnancy, unspecified trimester: Secondary | ICD-10-CM

## 2019-11-05 DIAGNOSIS — F410 Panic disorder [episodic paroxysmal anxiety] without agoraphobia: Secondary | ICD-10-CM

## 2019-11-05 DIAGNOSIS — B3731 Acute candidiasis of vulva and vagina: Secondary | ICD-10-CM

## 2019-11-05 DIAGNOSIS — O0990 Supervision of high risk pregnancy, unspecified, unspecified trimester: Secondary | ICD-10-CM

## 2019-11-05 DIAGNOSIS — O0972 Supervision of high risk pregnancy due to social problems, second trimester: Secondary | ICD-10-CM

## 2019-11-05 DIAGNOSIS — F411 Generalized anxiety disorder: Secondary | ICD-10-CM

## 2019-11-05 DIAGNOSIS — F319 Bipolar disorder, unspecified: Secondary | ICD-10-CM

## 2019-11-05 DIAGNOSIS — B373 Candidiasis of vulva and vagina: Secondary | ICD-10-CM

## 2019-11-05 DIAGNOSIS — R300 Dysuria: Secondary | ICD-10-CM

## 2019-11-05 LAB — POCT URINALYSIS DIPSTICK
Bilirubin, UA: NEGATIVE
Blood, UA: NEGATIVE
Glucose, UA: NEGATIVE
Ketones, UA: NEGATIVE
Leukocytes, UA: NEGATIVE
Nitrite, UA: NEGATIVE
Protein, UA: NEGATIVE
Spec Grav, UA: 1.01 (ref 1.010–1.025)
Urobilinogen, UA: 0.2 E.U./dL
pH, UA: 5 (ref 5.0–8.0)

## 2019-11-05 MED ORDER — HYDROXYZINE PAMOATE 25 MG PO CAPS: 25.0000 mg | ORAL_CAPSULE | Freq: Three times a day (TID) | ORAL | 3 refills | Status: DC | PRN

## 2019-11-05 MED ORDER — TERCONAZOLE 0.4 % VA CREA: 1.0000 | TOPICAL_CREAM | Freq: Every day | VAGINAL | 0 refills | Status: DC
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
## 2019-11-05 NOTE — Progress Notes (Signed)
Pt states she is having pelvic pain, increased anxiety and possible yeast infectoin

## 2019-11-05 NOTE — Patient Instructions (Signed)

## 2019-11-06 ENCOUNTER — Telehealth: Payer: Self-pay

## 2019-11-06 DIAGNOSIS — B373 Candidiasis of vulva and vagina: Secondary | ICD-10-CM

## 2019-11-06 DIAGNOSIS — B3731 Acute candidiasis of vulva and vagina: Secondary | ICD-10-CM

## 2019-11-06 DIAGNOSIS — F410 Panic disorder [episodic paroxysmal anxiety] without agoraphobia: Secondary | ICD-10-CM

## 2019-11-06 DIAGNOSIS — F411 Generalized anxiety disorder: Secondary | ICD-10-CM

## 2019-11-06 DIAGNOSIS — F419 Anxiety disorder, unspecified: Secondary | ICD-10-CM

## 2019-11-06 LAB — CERVICOVAGINAL ANCILLARY ONLY
Bacterial Vaginitis (gardnerella): POSITIVE — AB
Candida Glabrata: NEGATIVE
Candida Vaginitis: POSITIVE — AB
Chlamydia: NEGATIVE
Comment: NEGATIVE
Comment: NEGATIVE
Comment: NEGATIVE
Comment: NEGATIVE
Comment: NEGATIVE
Comment: NORMAL
Neisseria Gonorrhea: NEGATIVE
Trichomonas: NEGATIVE

## 2019-11-06 LAB — ALPHA FETOPROTEIN, MATERNAL
AFP MoM: 1.08
AFP, Serum: 28.8 ng/mL
Calc'd Gestational Age: 17.1 weeks
Maternal Wt: 283 [lb_av]
Risk for ONTD: <1
Twins-AFP: 1

## 2019-11-06 MED ORDER — HYDROXYZINE PAMOATE 25 MG PO CAPS: 25.0000 mg | ORAL_CAPSULE | Freq: Three times a day (TID) | ORAL | 3 refills | Status: DC | PRN

## 2019-11-06 MED ORDER — TERCONAZOLE 0.4 % VA CREA: 1.0000 | TOPICAL_CREAM | Freq: Every day | VAGINAL | 0 refills | Status: DC

## 2019-11-06 NOTE — Telephone Encounter (Signed)
Pt called requesting Rx for Terazol and Vistaril be sent to a different pharmacy. Prescriptions sent.

## 2019-11-07 ENCOUNTER — Telehealth: Payer: Self-pay | Admitting: Licensed Clinical Social Worker

## 2019-11-07 ENCOUNTER — Telehealth: Payer: Self-pay

## 2019-11-07 ENCOUNTER — Other Ambulatory Visit: Payer: Self-pay | Admitting: Advanced Practice Midwife

## 2019-11-07 DIAGNOSIS — F319 Bipolar disorder, unspecified: Secondary | ICD-10-CM

## 2019-11-07 DIAGNOSIS — O99342 Other mental disorders complicating pregnancy, second trimester: Secondary | ICD-10-CM

## 2019-11-07 LAB — CULTURE, OB URINE

## 2019-11-07 LAB — URINE CULTURE, OB REFLEX

## 2019-11-07 MED ORDER — LATUDA 60 MG PO TABS: 60.0000 mg | ORAL_TABLET | Freq: Every day | ORAL | 5 refills | Status: DC

## 2019-11-07 NOTE — Telephone Encounter (Signed)
Pt is requesting her Rx for Latuda to be switched to a different pharmacy. Rx sent to correct pharmacy.

## 2019-11-07 NOTE — Telephone Encounter (Signed)
Spoke with the patient about rescheduling her appointment. I informed her due to an emergency Roselyn Reef had, we needed to change her appointment to 3:00 pm. She stated she could not do that time, and she could only do 2:15. I offered to schedule her on a different day. She stated just schedule the appointment at 3:00, I just won't see my kids that day. Then she hung up the phone.

## 2019-11-11 ENCOUNTER — Ambulatory Visit (INDEPENDENT_AMBULATORY_CARE_PROVIDER_SITE_OTHER): Payer: Medicaid Other | Admitting: Licensed Clinical Social Worker

## 2019-11-11 ENCOUNTER — Other Ambulatory Visit: Payer: Self-pay

## 2019-11-11 DIAGNOSIS — Z658 Other specified problems related to psychosocial circumstances: Secondary | ICD-10-CM

## 2019-11-11 NOTE — BH Specialist Note (Signed)
Integrated Behavioral Health Initial Visit  MRN: [id number] Name: Tina Wood  Number of [facility] Clinician visits:: 1 Session Start time: 3:03pm  Session End time: 3:21pm Total time: 17 mins  Type of Service: [place] Interpretor:no  Interpretor Name and Language: none    Warm Hand Off Completed.       SUBJECTIVE: Tina Wood is a 29 y.o. female accompanied by n/a Patient was referred by Larena Glassman  for integrated behavioral health  Patient reports the following symptoms/concerns: bipolar, anxiety Duration of problem: approx one year ; Severity of problem: moderate   OBJECTIVE: Mood: Irritable  and Affect: congruent  Risk of harm to self or others: no   LIFE CONTEXT: Family and Social: n/a School/Work: n/a Self-Care: n/a Life Changes: pregnant   GOALS ADDRESSED: Patient will: 1. Reduce symptoms of:  2. Increase knowledge and/or ability of:   3. Demonstrate ability to:   INTERVENTIONS: Interventions utilized: brief supportive counseling   Standardized Assessments completed:   ASSESSMENT: Patient currently experiencing anxiety. Tina Wood was upset during the call. According to Tina Wood she is upset because her visits are not scheduled with Dr. Gala Romney and she does not like taking capsule form medication. Tina Wood spent most of the call complaining about the practice and reports most her anxiety is caused by center for women [facility] [place]. I have offered to assist Tina Wood with resources several times during the visit but she denied each one.    Patient may benefit from outpatient behavioral health .  PLAN: 1. Follow up with behavioral health clinician on : two weeks with J McManess 2. Behavioral recommendations: Continue to take prescribe medication and prenatal vitamins  3. Referral(s): none  4. "From scale of 1-10, how likely are you to follow plan?":   [name],  LCSW

## 2019-11-13 MED ORDER — LACTATED RINGERS IV SOLN
125.00 | INTRAVENOUS | Status: DC
Start: ? — End: 2019-11-13

## 2019-11-21 ENCOUNTER — Telehealth: Payer: Self-pay

## 2019-11-21 NOTE — Telephone Encounter (Signed)
Returning pt call. Pt states she has pelvic pain every so often. Denies bleeding. Pt is aware pelvic pain can be normal throughout pregnancy but, if it increases or she begins bleeding then she should report to MAU. Pt states she has been constipated for the past few days. Pt was instructed to use Miralax. Pt expressed understanding.

## 2019-11-26 ENCOUNTER — Other Ambulatory Visit: Payer: Self-pay

## 2019-11-26 ENCOUNTER — Encounter (HOSPITAL_COMMUNITY): Payer: Self-pay

## 2019-11-26 ENCOUNTER — Ambulatory Visit (HOSPITAL_COMMUNITY): Payer: Medicaid Other | Admitting: *Deleted

## 2019-11-26 ENCOUNTER — Ambulatory Visit (HOSPITAL_COMMUNITY)
Admission: RE | Admit: 2019-11-26 | Discharge: 2019-11-26 | Disposition: A | Discharge status: Home / Self Care | Payer: Medicaid Other | Source: Ambulatory Visit | Attending: Obstetrics | Admitting: Obstetrics

## 2019-11-26 VITALS — BP 130/80 | HR 118 | Temp 97.5°F

## 2019-11-26 DIAGNOSIS — Z363 Encounter for antenatal screening for malformations: Secondary | ICD-10-CM | POA: Diagnosis not present

## 2019-11-26 DIAGNOSIS — O99332 Smoking (tobacco) complicating pregnancy, second trimester: Secondary | ICD-10-CM

## 2019-11-26 DIAGNOSIS — O99350 Diseases of the nervous system complicating pregnancy, unspecified trimester: Secondary | ICD-10-CM

## 2019-11-26 DIAGNOSIS — Z3A20 20 weeks gestation of pregnancy: Secondary | ICD-10-CM

## 2019-11-26 DIAGNOSIS — O0990 Supervision of high risk pregnancy, unspecified, unspecified trimester: Secondary | ICD-10-CM | POA: Insufficient documentation

## 2019-11-26 DIAGNOSIS — G40909 Epilepsy, unspecified, not intractable, without status epilepticus: Secondary | ICD-10-CM

## 2019-11-26 DIAGNOSIS — O99212 Obesity complicating pregnancy, second trimester: Secondary | ICD-10-CM

## 2019-11-26 DIAGNOSIS — O2620 Pregnancy care for patient with recurrent pregnancy loss, unspecified trimester: Secondary | ICD-10-CM

## 2019-11-26 NOTE — Progress Notes (Signed)
Patient late for appt. Patient very angry. Going from one topic to another; her kids, her anxiety, states her Dr has not told her what meds to take, she does not have money for meds, did not have correct address for this appt..States she has not felt the baby move and no one cares. States she does not know if she is having bleeding or leaking because she has not urinated or had BM in 2 weeks because she has not eaten or had anything to drink. Will not keep mask up. Patient not willing to follow simple instrs like keeping mask up, uncrossing legs for BP. Patient yelling at RN. Patient escorted into room where Vance Peper, [place] had a discussion with her.

## 2019-11-27 ENCOUNTER — Other Ambulatory Visit (HOSPITAL_COMMUNITY): Payer: Self-pay | Admitting: *Deleted

## 2019-11-27 DIAGNOSIS — O99210 Obesity complicating pregnancy, unspecified trimester: Secondary | ICD-10-CM

## 2019-11-29 ENCOUNTER — Encounter: Payer: Self-pay | Admitting: Advanced Practice Midwife

## 2019-11-29 DIAGNOSIS — O350XX0 Maternal care for (suspected) central nervous system malformation in fetus, not applicable or unspecified: Secondary | ICD-10-CM | POA: Insufficient documentation

## 2019-11-29 DIAGNOSIS — IMO0002 Reserved for concepts with insufficient information to code with codable children: Secondary | ICD-10-CM | POA: Insufficient documentation

## 2019-12-09 ENCOUNTER — Other Ambulatory Visit (HOSPITAL_COMMUNITY)
Admission: RE | Admit: 2019-12-09 | Discharge: 2019-12-09 | Disposition: A | Discharge status: Home / Self Care | Payer: Medicaid Other | Source: Ambulatory Visit | Attending: Obstetrics & Gynecology | Admitting: Obstetrics & Gynecology

## 2019-12-09 ENCOUNTER — Ambulatory Visit (INDEPENDENT_AMBULATORY_CARE_PROVIDER_SITE_OTHER): Payer: Medicaid Other | Admitting: Obstetrics & Gynecology

## 2019-12-09 ENCOUNTER — Other Ambulatory Visit: Payer: Self-pay

## 2019-12-09 VITALS — BP 112/72 | HR 74 | Temp 98.4°F | Wt 287.0 lb

## 2019-12-09 DIAGNOSIS — Z3689 Encounter for other specified antenatal screening: Secondary | ICD-10-CM | POA: Diagnosis not present

## 2019-12-09 DIAGNOSIS — N898 Other specified noninflammatory disorders of vagina: Secondary | ICD-10-CM

## 2019-12-09 DIAGNOSIS — O99342 Other mental disorders complicating pregnancy, second trimester: Secondary | ICD-10-CM

## 2019-12-09 DIAGNOSIS — O0990 Supervision of high risk pregnancy, unspecified, unspecified trimester: Secondary | ICD-10-CM

## 2019-12-09 DIAGNOSIS — O0992 Supervision of high risk pregnancy, unspecified, second trimester: Secondary | ICD-10-CM

## 2019-12-09 DIAGNOSIS — F319 Bipolar disorder, unspecified: Secondary | ICD-10-CM

## 2019-12-09 DIAGNOSIS — Z3A22 22 weeks gestation of pregnancy: Secondary | ICD-10-CM

## 2019-12-09 DIAGNOSIS — Z658 Other specified problems related to psychosocial circumstances: Secondary | ICD-10-CM
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
## 2019-12-09 NOTE — Patient Instructions (Signed)
My Chart Help [phone]

## 2019-12-10 LAB — CERVICOVAGINAL ANCILLARY ONLY
Bacterial Vaginitis (gardnerella): POSITIVE — AB
Candida Glabrata: NEGATIVE
Candida Vaginitis: POSITIVE — AB
Chlamydia: NEGATIVE
Comment: NEGATIVE
Comment: NEGATIVE
Comment: NEGATIVE
Comment: NEGATIVE
Comment: NEGATIVE
Comment: NORMAL
Neisseria Gonorrhea: NEGATIVE
Trichomonas: NEGATIVE

## 2019-12-11 ENCOUNTER — Telehealth: Payer: Self-pay

## 2019-12-11 DIAGNOSIS — B9689 Other specified bacterial agents as the cause of diseases classified elsewhere: Secondary | ICD-10-CM

## 2019-12-11 DIAGNOSIS — B379 Candidiasis, unspecified: Secondary | ICD-10-CM

## 2019-12-11 DIAGNOSIS — N760 Acute vaginitis: Secondary | ICD-10-CM

## 2019-12-11 LAB — URINE CULTURE, OB REFLEX

## 2019-12-11 LAB — CULTURE, OB URINE

## 2019-12-11 MED ORDER — METRONIDAZOLE 500 MG PO TABS: 500.0000 mg | ORAL_TABLET | Freq: Two times a day (BID) | ORAL | 0 refills | Status: DC

## 2019-12-11 MED ORDER — TERCONAZOLE 0.4 % VA CREA: 1.0000 | TOPICAL_CREAM | Freq: Every day | VAGINAL | 0 refills | Status: DC

## 2019-12-11 NOTE — Telephone Encounter (Signed)
Spoke with pt and she is aware of positive yeast and BV. Flagyl and Terconazole sent per protocol.

## 2019-12-11 NOTE — Telephone Encounter (Addendum)
Attempted to call pt with results. Pt did not answer. Voicemail left.   ----- Message from Guss Bunde, MD sent at 12/10/2019 10:06 PM EDT ----- Treat per protocol-->please call patient and confirm pharmacy.  She now lives is [place].

## 2019-12-17 ENCOUNTER — Encounter (HOSPITAL_COMMUNITY): Payer: Self-pay | Admitting: Obstetrics and Gynecology

## 2019-12-17 ENCOUNTER — Other Ambulatory Visit: Payer: Self-pay

## 2019-12-17 ENCOUNTER — Inpatient Hospital Stay (HOSPITAL_BASED_OUTPATIENT_CLINIC_OR_DEPARTMENT_OTHER): Payer: Medicaid Other

## 2019-12-17 ENCOUNTER — Inpatient Hospital Stay (HOSPITAL_COMMUNITY)
Admission: AD | Admit: 2019-12-17 | Discharge: 2019-12-17 | Disposition: A | Discharge status: Home / Self Care | Payer: Medicaid Other | Attending: Obstetrics and Gynecology | Admitting: Obstetrics and Gynecology

## 2019-12-17 DIAGNOSIS — G40909 Epilepsy, unspecified, not intractable, without status epilepticus: Secondary | ICD-10-CM | POA: Diagnosis not present

## 2019-12-17 DIAGNOSIS — B373 Candidiasis of vulva and vagina: Secondary | ICD-10-CM

## 2019-12-17 DIAGNOSIS — O99332 Smoking (tobacco) complicating pregnancy, second trimester: Secondary | ICD-10-CM | POA: Insufficient documentation

## 2019-12-17 DIAGNOSIS — S3991XA Unspecified injury of abdomen, initial encounter: Secondary | ICD-10-CM | POA: Diagnosis not present

## 2019-12-17 DIAGNOSIS — O23592 Infection of other part of genital tract in pregnancy, second trimester: Secondary | ICD-10-CM | POA: Insufficient documentation

## 2019-12-17 DIAGNOSIS — O98812 Other maternal infectious and parasitic diseases complicating pregnancy, second trimester: Secondary | ICD-10-CM | POA: Insufficient documentation

## 2019-12-17 DIAGNOSIS — O9A212 Injury, poisoning and certain other consequences of external causes complicating pregnancy, second trimester: Secondary | ICD-10-CM | POA: Diagnosis not present

## 2019-12-17 DIAGNOSIS — O99342 Other mental disorders complicating pregnancy, second trimester: Secondary | ICD-10-CM

## 2019-12-17 DIAGNOSIS — O99352 Diseases of the nervous system complicating pregnancy, second trimester: Secondary | ICD-10-CM | POA: Diagnosis not present

## 2019-12-17 DIAGNOSIS — F17210 Nicotine dependence, cigarettes, uncomplicated: Secondary | ICD-10-CM | POA: Diagnosis not present

## 2019-12-17 DIAGNOSIS — R109 Unspecified abdominal pain: Secondary | ICD-10-CM | POA: Diagnosis present

## 2019-12-17 DIAGNOSIS — R102 Pelvic and perineal pain: Secondary | ICD-10-CM | POA: Diagnosis not present

## 2019-12-17 DIAGNOSIS — O2622 Pregnancy care for patient with recurrent pregnancy loss, second trimester: Secondary | ICD-10-CM | POA: Diagnosis not present

## 2019-12-17 DIAGNOSIS — O26892 Other specified pregnancy related conditions, second trimester: Secondary | ICD-10-CM

## 2019-12-17 DIAGNOSIS — Z3A23 23 weeks gestation of pregnancy: Secondary | ICD-10-CM

## 2019-12-17 DIAGNOSIS — B9689 Other specified bacterial agents as the cause of diseases classified elsewhere: Secondary | ICD-10-CM

## 2019-12-17 DIAGNOSIS — O99212 Obesity complicating pregnancy, second trimester: Secondary | ICD-10-CM

## 2019-12-17 DIAGNOSIS — F319 Bipolar disorder, unspecified: Secondary | ICD-10-CM

## 2019-12-17 DIAGNOSIS — B3731 Acute candidiasis of vulva and vagina: Secondary | ICD-10-CM

## 2019-12-17 DIAGNOSIS — N760 Acute vaginitis: Secondary | ICD-10-CM

## 2019-12-17 HISTORY — DX: Chronic obstructive pulmonary disease, unspecified: J44.9

## 2019-12-17 HISTORY — DX: Acute bronchiolitis, unspecified: J21.9

## 2019-12-17 MED ORDER — METRONIDAZOLE 0.75 % VA GEL
1.0000 | Freq: Every day | VAGINAL | Status: DC
  Filled 2019-12-17: qty 70

## 2019-12-17 MED ORDER — FLUCONAZOLE 150 MG PO TABS
150.0000 mg | ORAL_TABLET | Freq: Once | ORAL | Status: AC
  Administered 2019-12-17: 150 mg via ORAL
  Filled 2019-12-17: qty 1

## 2019-12-17 MED ORDER — COMFORT FIT MATERNITY SUPP LG MISC: 1.0000 [IU] | Freq: Every day | 0 refills | Status: DC

## 2019-12-17 MED ORDER — NALBUPHINE HCL 10 MG/ML IJ SOLN
10.0000 mg | Freq: Once | INTRAMUSCULAR | Status: AC
  Administered 2019-12-17: 03:00:00 10 mg via INTRAMUSCULAR
  Filled 2019-12-17: qty 1
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
## 2019-12-17 NOTE — Discharge Instructions (Signed)
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
## 2019-12-19 ENCOUNTER — Ambulatory Visit (INDEPENDENT_AMBULATORY_CARE_PROVIDER_SITE_OTHER): Payer: Medicaid Other | Admitting: Obstetrics and Gynecology

## 2019-12-19 ENCOUNTER — Encounter: Payer: Self-pay | Admitting: Obstetrics and Gynecology

## 2019-12-19 ENCOUNTER — Other Ambulatory Visit: Payer: Self-pay

## 2019-12-19 VITALS — BP 120/68 | HR 95 | Wt 287.2 lb

## 2019-12-19 DIAGNOSIS — Z8659 Personal history of other mental and behavioral disorders: Secondary | ICD-10-CM

## 2019-12-19 DIAGNOSIS — Z3A23 23 weeks gestation of pregnancy: Secondary | ICD-10-CM

## 2019-12-19 DIAGNOSIS — Z1389 Encounter for screening for other disorder: Secondary | ICD-10-CM

## 2019-12-19 DIAGNOSIS — O0992 Supervision of high risk pregnancy, unspecified, second trimester: Secondary | ICD-10-CM

## 2019-12-19 DIAGNOSIS — Z331 Pregnant state, incidental: Secondary | ICD-10-CM

## 2019-12-19 LAB — POCT URINALYSIS DIPSTICK OB
Blood, UA: NEGATIVE
Glucose, UA: NEGATIVE
Ketones, UA: NEGATIVE
Leukocytes, UA: NEGATIVE
Nitrite, UA: NEGATIVE
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
## 2019-12-24 ENCOUNTER — Ambulatory Visit (HOSPITAL_COMMUNITY)
Admission: RE | Admit: 2019-12-24 | Discharge: 2019-12-24 | Disposition: A | Discharge status: Home / Self Care | Payer: Medicaid Other | Source: Ambulatory Visit | Attending: Obstetrics and Gynecology | Admitting: Obstetrics and Gynecology

## 2019-12-24 ENCOUNTER — Encounter (HOSPITAL_COMMUNITY): Payer: Self-pay

## 2019-12-24 ENCOUNTER — Other Ambulatory Visit (HOSPITAL_COMMUNITY): Payer: Self-pay | Admitting: *Deleted

## 2019-12-24 ENCOUNTER — Other Ambulatory Visit: Payer: Self-pay

## 2019-12-24 ENCOUNTER — Ambulatory Visit (HOSPITAL_COMMUNITY): Payer: Medicaid Other | Admitting: *Deleted

## 2019-12-24 DIAGNOSIS — IMO0002 Reserved for concepts with insufficient information to code with codable children: Secondary | ICD-10-CM

## 2019-12-24 DIAGNOSIS — O99212 Obesity complicating pregnancy, second trimester: Secondary | ICD-10-CM

## 2019-12-24 DIAGNOSIS — G40909 Epilepsy, unspecified, not intractable, without status epilepticus: Secondary | ICD-10-CM

## 2019-12-24 DIAGNOSIS — O99210 Obesity complicating pregnancy, unspecified trimester: Secondary | ICD-10-CM

## 2019-12-24 DIAGNOSIS — O350XX0 Maternal care for (suspected) central nervous system malformation in fetus, not applicable or unspecified: Secondary | ICD-10-CM | POA: Diagnosis present

## 2019-12-24 DIAGNOSIS — O99332 Smoking (tobacco) complicating pregnancy, second trimester: Secondary | ICD-10-CM | POA: Diagnosis not present

## 2019-12-24 DIAGNOSIS — Z3A24 24 weeks gestation of pregnancy: Secondary | ICD-10-CM

## 2019-12-24 DIAGNOSIS — O2622 Pregnancy care for patient with recurrent pregnancy loss, second trimester: Secondary | ICD-10-CM

## 2019-12-24 DIAGNOSIS — O99352 Diseases of the nervous system complicating pregnancy, second trimester: Secondary | ICD-10-CM

## 2019-12-24 DIAGNOSIS — F338 Other recurrent depressive disorders: Secondary | ICD-10-CM

## 2019-12-24 DIAGNOSIS — O99342 Other mental disorders complicating pregnancy, second trimester: Secondary | ICD-10-CM

## 2019-12-28 ENCOUNTER — Inpatient Hospital Stay (HOSPITAL_COMMUNITY)
Admission: AD | Admit: 2019-12-28 | Discharge: 2019-12-29 | Discharge status: Against Medical Advice | Payer: Medicaid Other | Source: Ambulatory Visit | Attending: Obstetrics and Gynecology | Admitting: Obstetrics and Gynecology

## 2019-12-28 ENCOUNTER — Other Ambulatory Visit: Payer: Self-pay

## 2019-12-28 ENCOUNTER — Telehealth: Payer: Self-pay | Admitting: Obstetrics and Gynecology

## 2019-12-28 DIAGNOSIS — G43909 Migraine, unspecified, not intractable, without status migrainosus: Secondary | ICD-10-CM | POA: Insufficient documentation

## 2019-12-28 DIAGNOSIS — F17210 Nicotine dependence, cigarettes, uncomplicated: Secondary | ICD-10-CM | POA: Insufficient documentation

## 2019-12-28 DIAGNOSIS — O99352 Diseases of the nervous system complicating pregnancy, second trimester: Secondary | ICD-10-CM | POA: Insufficient documentation

## 2019-12-28 DIAGNOSIS — Z3A24 24 weeks gestation of pregnancy: Secondary | ICD-10-CM | POA: Insufficient documentation

## 2019-12-28 DIAGNOSIS — O99512 Diseases of the respiratory system complicating pregnancy, second trimester: Secondary | ICD-10-CM | POA: Insufficient documentation

## 2019-12-28 DIAGNOSIS — J449 Chronic obstructive pulmonary disease, unspecified: Secondary | ICD-10-CM | POA: Insufficient documentation

## 2019-12-28 DIAGNOSIS — R109 Unspecified abdominal pain: Secondary | ICD-10-CM | POA: Insufficient documentation

## 2019-12-28 DIAGNOSIS — O26892 Other specified pregnancy related conditions, second trimester: Secondary | ICD-10-CM | POA: Insufficient documentation

## 2019-12-28 DIAGNOSIS — O99332 Smoking (tobacco) complicating pregnancy, second trimester: Secondary | ICD-10-CM | POA: Insufficient documentation
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
## 2019-12-29 DIAGNOSIS — O99512 Diseases of the respiratory system complicating pregnancy, second trimester: Secondary | ICD-10-CM | POA: Diagnosis not present

## 2019-12-29 DIAGNOSIS — O99352 Diseases of the nervous system complicating pregnancy, second trimester: Secondary | ICD-10-CM | POA: Diagnosis not present

## 2019-12-29 DIAGNOSIS — F17210 Nicotine dependence, cigarettes, uncomplicated: Secondary | ICD-10-CM | POA: Diagnosis not present

## 2019-12-29 DIAGNOSIS — G43909 Migraine, unspecified, not intractable, without status migrainosus: Secondary | ICD-10-CM | POA: Diagnosis not present

## 2019-12-29 DIAGNOSIS — O26892 Other specified pregnancy related conditions, second trimester: Secondary | ICD-10-CM | POA: Diagnosis present

## 2019-12-29 DIAGNOSIS — O99332 Smoking (tobacco) complicating pregnancy, second trimester: Secondary | ICD-10-CM | POA: Diagnosis not present

## 2019-12-29 DIAGNOSIS — R109 Unspecified abdominal pain: Secondary | ICD-10-CM | POA: Diagnosis not present

## 2019-12-29 DIAGNOSIS — Z3A24 24 weeks gestation of pregnancy: Secondary | ICD-10-CM | POA: Diagnosis not present

## 2019-12-29 DIAGNOSIS — J449 Chronic obstructive pulmonary disease, unspecified: Secondary | ICD-10-CM | POA: Diagnosis not present

## 2019-12-29 MED ORDER — NALBUPHINE HCL 10 MG/ML IJ SOLN: 10.0000 mg | Freq: Once | INTRAMUSCULAR | Status: DC

## 2019-12-29 NOTE — MAU Note (Signed)
Pt reports to MAU c/o severe sharp pains on front left side of her stomach that started at 2130. Pt denies bleeding. Pt denies any discharge. Pt rates pain 9/10. Pt reports decreased FM since 2130.

## 2019-12-29 NOTE — MAU Note (Signed)
AMA form signed and placed in patient records.

## 2019-12-29 NOTE — MAU Provider Note (Signed)
History     CSN: [id number]  Arrival date and time: 12/28/19 2351   First Provider Initiated Contact with Patient 12/29/19 0113      No chief complaint on file.  Tina Wood is a 29 y.o. [id number] at [redacted]w[redacted]d who receives care at [facility] as she reports she was dismissed for [facility].  She presents today for abdominal pain.  Patient asleep upon provider arrival. Requests lights remain off d/t migraine. She states the abdominal pain that started yesterday at 11am and then resolved only to return at 2130.  Patient states that the pain feels like "BH contractions because it is the same pain I had on April 20th."  She states she has not tried taking any OTC medication "because nothing works."  Patient reports the pain is intensified with "laying on my right side, sitting down, standing up, and laying on my back and laying halfway on my stomach and halfway on my side."  Patient endorses fetal movement since arrival, but states "it wasn't since 930 and she is still too small to completely feel anyway."  Patient reports pain is improved by "obviously laying on my left side."  Patient rates the pain a 8/10, which she states is down from a 9/10.  She denies vaginal concerns and states she has a hemorrhoid. She reports she was nauseated and "throwing up blood, but I throw up blood on a day to day basis because I have a stomach ulcer." Patient reports a migraine headache, but refuses treatment stating "nothing helps."  Patient goes on to state that "the only thing that helped was the medication they gave me last time which was in a shot."  Patient requests to drink fluids and informed that she should abstain until after [place] completed in case surgical intervention required.  Patient states that she is unable to leave urine sample and that she "has cotton mouth."   Patient offered IV fluids and declines stating that she will just throw it up.  Patient offered antiemetic for nausea and states she can not take Zofran  because it is the reason why her 54 month old is delayed to a 59-24 month old level.  Patient goes on to state that "y'all are just wasting my damn time."        OB History    Gravida  10   Para  2   Term  2   Preterm      AB  7   Living  2     SAB  7   TAB      Ectopic      Multiple  0   Live Births  2           Past Medical History:  Diagnosis Date  . Asthma   . Brain tumor (benign) ([place])   . Bronchiolitis   . COPD (chronic obstructive pulmonary disease) ([place])   . Seizures ([place])     Past Surgical History:  Procedure Laterality Date  . SKIN GRAFT    . WRIST SURGERY      Family History  Family history unknown: Yes    Social History   Tobacco Use  . Smoking status: Current Every Day Smoker    Packs/day: 0.50    Years: 9.00    Pack years: 4.50    Types: Cigarettes  . Smokeless tobacco: Former Systems developer    Types: Snuff  Substance Use Topics  . Alcohol use: No  . Drug use: No  Allergies:  Allergies  Allergen Reactions  . Bee Venom Swelling and Anaphylaxis  . Fish Allergy Anaphylaxis  . Peanut-Containing Drug Products   . Pomegranate [Punica] Anaphylaxis  . Cranberry Rash    Swelling and rash  . Other Rash and Other (See Comments)    Ultrasound gel  . Shellfish Allergy Diarrhea and Swelling  . Latex Itching  . Tape Rash    Medications Prior to Admission  Medication Sig Dispense Refill Last Dose  . Elastic Bandages & Supports (COMFORT FIT MATERNITY SUPP LG) MISC 1 Units by Does not apply route daily. (Patient not taking: Reported on 12/19/2019) 1 each 0   . metroNIDAZOLE (FLAGYL) 500 MG tablet Take 1 tablet (500 mg total) by mouth 2 (two) times daily. (Patient not taking: Reported on 12/19/2019) 14 tablet 0   . terconazole (TERAZOL 7) 0.4 % vaginal cream Place 1 applicator vaginally at bedtime. (Patient not taking: Reported on 12/19/2019) 45 g 0     Review of Systems  Constitutional: Positive for fever (She reports she had a low grade  fever, but is unable to check it. ). Negative for chills.  Respiratory: Positive for cough and shortness of breath.   Gastrointestinal: Positive for abdominal pain, diarrhea, nausea and vomiting. Negative for constipation.  Genitourinary: Negative for difficulty urinating, dysuria, vaginal bleeding and vaginal discharge.  Neurological: Positive for headaches (Migraine). Negative for dizziness and light-headedness.   Physical Exam   Blood pressure (!) 106/58, pulse 84, temperature 98.3 F (36.8 C), temperature source Oral, resp. rate 18, last menstrual period 09/12/2019, SpO2 95 %, unknown if currently breastfeeding.  Physical Exam  Constitutional: She is oriented to person, place, and time.  Obese  HENT:  Head: Normocephalic and atraumatic.  Eyes: Conjunctivae are normal.  Cardiovascular: Normal rate, regular rhythm and normal heart sounds.  Respiratory: Effort normal and breath sounds normal. No respiratory distress.  GI: Soft. Bowel sounds are normal. There is abdominal tenderness in the right upper quadrant and left lower quadrant.  Musculoskeletal:     Cervical back: Normal range of motion.  Neurological: She is alert and oriented to person, place, and time.  Skin: Skin is warm and dry.  Psychiatric: She has a normal mood and affect. Her behavior is normal.    Fetal Assessment 145 bpm Toco: None graphed   MAU Course  No results found for this or any previous visit (from the past 24 hour(s)). No results found.  MDM PE Labs:CBC, CMP, Amylase, Lipase EFM  Assessment and Plan  29 year old [id number]  SIUP at 24.6 weeks Abdominal Pain   -Exam performed and findings discussed. -EFM discontinued as patient refusing to reposition for optimal FHR tracing.  -Will order labs -Abdominal US ordered.    -Patient informed that provider will review chart and give medication for pain prior to [place].  -Patient informed that [place] will not evaluate fetus and patient becomes further irate  stating that she needs to know if the baby is okay. -Patient informed that fetal heart tones were noted, but the [place] is to evaluate cause of abdominal pain. -Patient becomes argumentative and provider leaves bedside. -Provider returns with nurse and patient continues to be argumentative and has also refused labs per phlebotomy. -Patient states she would like to leave. -Given AMA paperwork   Maryann Conners MSN, CNM 12/29/2019, 1:13 AM

## 2019-12-29 NOTE — MAU Note (Signed)
Lab notified that pt refused labs. Provider notified.

## 2020-01-01 ENCOUNTER — Inpatient Hospital Stay (HOSPITAL_COMMUNITY): Payer: Medicaid Other

## 2020-01-01 ENCOUNTER — Encounter (HOSPITAL_COMMUNITY): Payer: Self-pay | Admitting: Obstetrics & Gynecology

## 2020-01-01 ENCOUNTER — Inpatient Hospital Stay (HOSPITAL_BASED_OUTPATIENT_CLINIC_OR_DEPARTMENT_OTHER): Payer: Medicaid Other

## 2020-01-01 ENCOUNTER — Inpatient Hospital Stay (HOSPITAL_COMMUNITY)
Admission: AD | Admit: 2020-01-01 | Discharge: 2020-01-01 | Disposition: A | Discharge status: Home / Self Care | Payer: Medicaid Other | Attending: Obstetrics and Gynecology | Admitting: Obstetrics and Gynecology

## 2020-01-01 DIAGNOSIS — O26892 Other specified pregnancy related conditions, second trimester: Secondary | ICD-10-CM | POA: Insufficient documentation

## 2020-01-01 DIAGNOSIS — R1030 Lower abdominal pain, unspecified: Secondary | ICD-10-CM | POA: Diagnosis present

## 2020-01-01 DIAGNOSIS — R109 Unspecified abdominal pain: Secondary | ICD-10-CM

## 2020-01-01 DIAGNOSIS — IMO0002 Reserved for concepts with insufficient information to code with codable children: Secondary | ICD-10-CM

## 2020-01-01 DIAGNOSIS — N200 Calculus of kidney: Secondary | ICD-10-CM | POA: Diagnosis not present

## 2020-01-01 DIAGNOSIS — R1031 Right lower quadrant pain: Secondary | ICD-10-CM | POA: Diagnosis not present

## 2020-01-01 DIAGNOSIS — O99352 Diseases of the nervous system complicating pregnancy, second trimester: Secondary | ICD-10-CM | POA: Diagnosis not present

## 2020-01-01 DIAGNOSIS — O26832 Pregnancy related renal disease, second trimester: Secondary | ICD-10-CM | POA: Insufficient documentation

## 2020-01-01 DIAGNOSIS — F17210 Nicotine dependence, cigarettes, uncomplicated: Secondary | ICD-10-CM | POA: Diagnosis not present

## 2020-01-01 DIAGNOSIS — O2622 Pregnancy care for patient with recurrent pregnancy loss, second trimester: Secondary | ICD-10-CM | POA: Diagnosis not present

## 2020-01-01 DIAGNOSIS — Z3A25 25 weeks gestation of pregnancy: Secondary | ICD-10-CM

## 2020-01-01 DIAGNOSIS — O350XX0 Maternal care for (suspected) central nervous system malformation in fetus, not applicable or unspecified: Secondary | ICD-10-CM

## 2020-01-01 DIAGNOSIS — T1490XA Injury, unspecified, initial encounter: Secondary | ICD-10-CM | POA: Diagnosis not present

## 2020-01-01 DIAGNOSIS — O99891 Other specified diseases and conditions complicating pregnancy: Secondary | ICD-10-CM | POA: Diagnosis not present

## 2020-01-01 DIAGNOSIS — O9A212 Injury, poisoning and certain other consequences of external causes complicating pregnancy, second trimester: Secondary | ICD-10-CM | POA: Diagnosis not present

## 2020-01-01 DIAGNOSIS — F99 Mental disorder, not otherwise specified: Secondary | ICD-10-CM

## 2020-01-01 DIAGNOSIS — O99332 Smoking (tobacco) complicating pregnancy, second trimester: Secondary | ICD-10-CM

## 2020-01-01 DIAGNOSIS — O99212 Obesity complicating pregnancy, second trimester: Secondary | ICD-10-CM

## 2020-01-01 DIAGNOSIS — F17200 Nicotine dependence, unspecified, uncomplicated: Secondary | ICD-10-CM

## 2020-01-01 DIAGNOSIS — R110 Nausea: Secondary | ICD-10-CM | POA: Insufficient documentation

## 2020-01-01 DIAGNOSIS — R079 Chest pain, unspecified: Secondary | ICD-10-CM

## 2020-01-01 DIAGNOSIS — O26899 Other specified pregnancy related conditions, unspecified trimester: Secondary | ICD-10-CM

## 2020-01-01 DIAGNOSIS — O99342 Other mental disorders complicating pregnancy, second trimester: Secondary | ICD-10-CM

## 2020-01-01 DIAGNOSIS — G40909 Epilepsy, unspecified, not intractable, without status epilepticus: Secondary | ICD-10-CM

## 2020-01-01 LAB — COMPREHENSIVE METABOLIC PANEL
ALT: 16 U/L (ref 0–44)
AST: 14 U/L — ABNORMAL LOW (ref 15–41)
Albumin: 2.7 g/dL — ABNORMAL LOW (ref 3.5–5.0)
Alkaline Phosphatase: 66 U/L (ref 38–126)
Anion gap: 13 (ref 5–15)
BUN: 6 mg/dL (ref 6–20)
CO2: 19 mmol/L — ABNORMAL LOW (ref 22–32)
Calcium: 9.1 mg/dL (ref 8.9–10.3)
Chloride: 106 mmol/L (ref 98–111)
Creatinine, Ser: 0.47 mg/dL (ref 0.44–1.00)
GFR calc Af Amer: >60 mL/min (ref >60)
GFR calc non Af Amer: >60 mL/min (ref >60)
Glucose, Bld: 95 mg/dL (ref 70–99)
Potassium: 3.6 mmol/L (ref 3.5–5.1)
Sodium: 138 mmol/L (ref 135–145)
Total Bilirubin: 0.4 mg/dL (ref 0.3–1.2)
Total Protein: 6.2 g/dL — ABNORMAL LOW (ref 6.5–8.1)

## 2020-01-01 LAB — URINALYSIS, ROUTINE W REFLEX MICROSCOPIC
Bilirubin Urine: NEGATIVE
Glucose, UA: NEGATIVE mg/dL
Hgb urine dipstick: NEGATIVE
Ketones, ur: NEGATIVE mg/dL
Leukocytes,Ua: NEGATIVE
Nitrite: NEGATIVE
Protein, ur: NEGATIVE mg/dL
Specific Gravity, Urine: 1.011 (ref 1.005–1.030)
pH: 6 (ref 5.0–8.0)

## 2020-01-01 LAB — CBC WITH DIFFERENTIAL/PLATELET
Abs Immature Granulocytes: 0.05 10*3/uL (ref 0.00–0.07)
Basophils Absolute: 0.1 10*3/uL (ref 0.0–0.1)
Basophils Relative: 0 %
Eosinophils Absolute: 0.1 10*3/uL (ref 0.0–0.5)
Eosinophils Relative: 1 %
HCT: 35.7 % — ABNORMAL LOW (ref 36.0–46.0)
Hemoglobin: 11.8 g/dL — ABNORMAL LOW (ref 12.0–15.0)
Immature Granulocytes: 0 %
Lymphocytes Relative: 24 %
Lymphs Abs: 3.4 10*3/uL (ref 0.7–4.0)
MCH: 27.4 pg (ref 26.0–34.0)
MCHC: 33.1 g/dL (ref 30.0–36.0)
MCV: 82.8 fL (ref 80.0–100.0)
Monocytes Absolute: 0.6 10*3/uL (ref 0.1–1.0)
Monocytes Relative: 4 %
Neutro Abs: 9.8 10*3/uL — ABNORMAL HIGH (ref 1.7–7.7)
Neutrophils Relative %: 71 %
Platelets: 285 10*3/uL (ref 150–400)
RBC: 4.31 MIL/uL (ref 3.87–5.11)
RDW: 13.7 % (ref 11.5–15.5)
WBC: 14 10*3/uL — ABNORMAL HIGH (ref 4.0–10.5)
nRBC: 0 % (ref 0.0–0.2)

## 2020-01-01 LAB — LIPASE, BLOOD: Lipase: 20 U/L (ref 11–51)

## 2020-01-01 LAB — TROPONIN I (HIGH SENSITIVITY): Troponin I (High Sensitivity): <2 ng/L (ref <18)

## 2020-01-01 MED ORDER — OXYCODONE-ACETAMINOPHEN 5-325 MG PO TABS: 2.0000 | ORAL_TABLET | Freq: Four times a day (QID) | ORAL | 0 refills | Status: DC | PRN

## 2020-01-01 MED ORDER — TAMSULOSIN HCL 0.4 MG PO CAPS: 0.8000 mg | ORAL_CAPSULE | Freq: Every day | ORAL | 0 refills | Status: DC

## 2020-01-01 MED ORDER — NALBUPHINE HCL 10 MG/ML IJ SOLN
10.0000 mg | Freq: Once | INTRAMUSCULAR | Status: AC
  Administered 2020-01-01: 10 mg via INTRAMUSCULAR
  Filled 2020-01-01: qty 1
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
## 2020-01-03 ENCOUNTER — Inpatient Hospital Stay (EMERGENCY_DEPARTMENT_HOSPITAL)
Admission: AD | Admit: 2020-01-03 | Discharge: 2020-01-03 | Disposition: A | Discharge status: Still In Hospital | Payer: Medicaid Other | Source: Home / Self Care | Attending: Obstetrics and Gynecology | Admitting: Obstetrics and Gynecology

## 2020-01-03 ENCOUNTER — Emergency Department (HOSPITAL_COMMUNITY)
Admission: EM | Admit: 2020-01-03 | Discharge: 2020-01-05 | Disposition: A | Discharge status: Home / Self Care | Payer: Medicaid Other | Attending: Emergency Medicine | Admitting: Emergency Medicine

## 2020-01-03 ENCOUNTER — Other Ambulatory Visit: Payer: Self-pay

## 2020-01-03 ENCOUNTER — Encounter (HOSPITAL_COMMUNITY): Payer: Self-pay | Admitting: *Deleted

## 2020-01-03 ENCOUNTER — Encounter (HOSPITAL_COMMUNITY): Payer: Self-pay | Admitting: Obstetrics and Gynecology

## 2020-01-03 DIAGNOSIS — R1084 Generalized abdominal pain: Secondary | ICD-10-CM | POA: Diagnosis not present

## 2020-01-03 DIAGNOSIS — F319 Bipolar disorder, unspecified: Secondary | ICD-10-CM | POA: Insufficient documentation

## 2020-01-03 DIAGNOSIS — Z659 Problem related to unspecified psychosocial circumstances: Secondary | ICD-10-CM

## 2020-01-03 DIAGNOSIS — Z91018 Allergy to other foods: Secondary | ICD-10-CM | POA: Insufficient documentation

## 2020-01-03 DIAGNOSIS — J449 Chronic obstructive pulmonary disease, unspecified: Secondary | ICD-10-CM | POA: Diagnosis not present

## 2020-01-03 DIAGNOSIS — J45909 Unspecified asthma, uncomplicated: Secondary | ICD-10-CM | POA: Insufficient documentation

## 2020-01-03 DIAGNOSIS — F3130 Bipolar disorder, current episode depressed, mild or moderate severity, unspecified: Secondary | ICD-10-CM | POA: Diagnosis present

## 2020-01-03 DIAGNOSIS — Z91013 Allergy to seafood: Secondary | ICD-10-CM | POA: Insufficient documentation

## 2020-01-03 DIAGNOSIS — Z3A25 25 weeks gestation of pregnancy: Secondary | ICD-10-CM | POA: Insufficient documentation

## 2020-01-03 DIAGNOSIS — O99512 Diseases of the respiratory system complicating pregnancy, second trimester: Secondary | ICD-10-CM | POA: Insufficient documentation

## 2020-01-03 DIAGNOSIS — Z3493 Encounter for supervision of normal pregnancy, unspecified, third trimester: Secondary | ICD-10-CM

## 2020-01-03 DIAGNOSIS — O99332 Smoking (tobacco) complicating pregnancy, second trimester: Secondary | ICD-10-CM | POA: Insufficient documentation

## 2020-01-03 DIAGNOSIS — Z91030 Bee allergy status: Secondary | ICD-10-CM | POA: Insufficient documentation

## 2020-01-03 DIAGNOSIS — R45851 Suicidal ideations: Secondary | ICD-10-CM | POA: Diagnosis not present

## 2020-01-03 DIAGNOSIS — O99891 Other specified diseases and conditions complicating pregnancy: Secondary | ICD-10-CM

## 2020-01-03 DIAGNOSIS — O99342 Other mental disorders complicating pregnancy, second trimester: Secondary | ICD-10-CM | POA: Insufficient documentation

## 2020-01-03 DIAGNOSIS — Z91040 Latex allergy status: Secondary | ICD-10-CM | POA: Insufficient documentation

## 2020-01-03 DIAGNOSIS — F4310 Post-traumatic stress disorder, unspecified: Secondary | ICD-10-CM | POA: Insufficient documentation

## 2020-01-03 DIAGNOSIS — F332 Major depressive disorder, recurrent severe without psychotic features: Secondary | ICD-10-CM | POA: Diagnosis not present

## 2020-01-03 DIAGNOSIS — Z20822 Contact with and (suspected) exposure to covid-19: Secondary | ICD-10-CM | POA: Insufficient documentation

## 2020-01-03 DIAGNOSIS — F17210 Nicotine dependence, cigarettes, uncomplicated: Secondary | ICD-10-CM | POA: Diagnosis not present

## 2020-01-03 DIAGNOSIS — O26892 Other specified pregnancy related conditions, second trimester: Secondary | ICD-10-CM | POA: Insufficient documentation

## 2020-01-03 DIAGNOSIS — T7491XD Unspecified adult maltreatment, confirmed, subsequent encounter: Secondary | ICD-10-CM

## 2020-01-03 DIAGNOSIS — T7491XA Unspecified adult maltreatment, confirmed, initial encounter: Secondary | ICD-10-CM

## 2020-01-03 DIAGNOSIS — Z91010 Allergy to peanuts: Secondary | ICD-10-CM | POA: Insufficient documentation

## 2020-01-03 LAB — RAPID URINE DRUG SCREEN, HOSP PERFORMED
Amphetamines: NOT DETECTED
Barbiturates: NOT DETECTED
Benzodiazepines: NOT DETECTED
Cocaine: NOT DETECTED
Opiates: NOT DETECTED
Tetrahydrocannabinol: NOT DETECTED

## 2020-01-03 LAB — COMPREHENSIVE METABOLIC PANEL
ALT: 16 U/L (ref 0–44)
AST: 15 U/L (ref 15–41)
Albumin: 2.7 g/dL — ABNORMAL LOW (ref 3.5–5.0)
Alkaline Phosphatase: 66 U/L (ref 38–126)
Anion gap: 10 (ref 5–15)
BUN: <5 mg/dL — ABNORMAL LOW (ref 6–20)
CO2: 19 mmol/L — ABNORMAL LOW (ref 22–32)
Calcium: 8.6 mg/dL — ABNORMAL LOW (ref 8.9–10.3)
Chloride: 107 mmol/L (ref 98–111)
Creatinine, Ser: 0.47 mg/dL (ref 0.44–1.00)
GFR calc Af Amer: >60 mL/min (ref >60)
GFR calc non Af Amer: >60 mL/min (ref >60)
Glucose, Bld: 95 mg/dL (ref 70–99)
Potassium: 3.5 mmol/L (ref 3.5–5.1)
Sodium: 136 mmol/L (ref 135–145)
Total Bilirubin: 0.3 mg/dL (ref 0.3–1.2)
Total Protein: 6.3 g/dL — ABNORMAL LOW (ref 6.5–8.1)

## 2020-01-03 LAB — CBC
HCT: 36 % (ref 36.0–46.0)
Hemoglobin: 11.5 g/dL — ABNORMAL LOW (ref 12.0–15.0)
MCH: 27.2 pg (ref 26.0–34.0)
MCHC: 31.9 g/dL (ref 30.0–36.0)
MCV: 85.1 fL (ref 80.0–100.0)
Platelets: 270 10*3/uL (ref 150–400)
RBC: 4.23 MIL/uL (ref 3.87–5.11)
RDW: 13.7 % (ref 11.5–15.5)
WBC: 13.8 10*3/uL — ABNORMAL HIGH (ref 4.0–10.5)
nRBC: 0 % (ref 0.0–0.2)

## 2020-01-03 LAB — RESPIRATORY PANEL BY RT PCR (FLU A&B, COVID)
Influenza A by PCR: NEGATIVE
Influenza B by PCR: NEGATIVE
SARS Coronavirus 2 by RT PCR: NEGATIVE

## 2020-01-03 LAB — I-STAT BETA HCG BLOOD, ED (MC, WL, AP ONLY): I-stat hCG, quantitative: >2000 m[IU]/mL — ABNORMAL HIGH (ref <5)

## 2020-01-03 LAB — SALICYLATE LEVEL: Salicylate Lvl: <7 mg/dL — ABNORMAL LOW (ref 7.0–30.0)

## 2020-01-03 LAB — ACETAMINOPHEN LEVEL: Acetaminophen (Tylenol), Serum: 11 ug/mL (ref 10–30)

## 2020-01-03 LAB — ETHANOL: Alcohol, Ethyl (B): <10 mg/dL (ref <10)

## 2020-01-03 MED ORDER — TAMSULOSIN HCL 0.4 MG PO CAPS
0.4000 mg | ORAL_CAPSULE | Freq: Once | ORAL | Status: AC
  Administered 2020-01-03: 0.4 mg via ORAL
  Filled 2020-01-03: qty 1

## 2020-01-03 MED ORDER — ACETAMINOPHEN 325 MG PO TABS
650.0000 mg | ORAL_TABLET | ORAL | Status: DC | PRN
  Administered 2020-01-03 – 2020-01-05 (×3): 650 mg via ORAL
  Filled 2020-01-03 (×4): qty 2

## 2020-01-03 MED ORDER — ALUM & MAG HYDROXIDE-SIMETH 200-200-20 MG/5ML PO SUSP: 30.0000 mL | Freq: Four times a day (QID) | ORAL | Status: DC | PRN

## 2020-01-03 MED ORDER — OXYCODONE-ACETAMINOPHEN 5-325 MG PO TABS
2.0000 | ORAL_TABLET | Freq: Once | ORAL | Status: AC
  Administered 2020-01-03: 2 via ORAL
  Filled 2020-01-03: qty 2

## 2020-01-03 MED ORDER — ONDANSETRON HCL 4 MG PO TABS
4.0000 mg | ORAL_TABLET | Freq: Three times a day (TID) | ORAL | Status: DC | PRN
  Administered 2020-01-03 – 2020-01-05 (×2): 4 mg via ORAL
  Filled 2020-01-03 (×3): qty 1

## 2020-01-03 MED ORDER — NICOTINE 7 MG/24HR TD PT24
7.0000 mg | MEDICATED_PATCH | Freq: Every day | TRANSDERMAL | Status: DC
  Filled 2020-01-03: qty 1

## 2020-01-03 NOTE — BH Assessment (Signed)
[place] Assessment Progress Note    Case was staffed with Marcello Moores NP who recommended a inpatient admission for stabilization.

## 2020-01-03 NOTE — MAU Note (Addendum)
Tina Wood CNM stated that Dr.Cardama will be accepting this patient for further evaluation since patient was clear OB. [facility] charge nurse [name] notified of patients coming. Pt to registration at [facility].
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
## 2020-01-03 NOTE — BH Assessment (Signed)
Case was staffed with Marcello Moores NP who recommended a inpatient admission for stabilization. Patient referred to [facility] and is under review. Patient also referred to the following hospitals for review and consideration of a bed.  [facility] Details    [facility] Details    [facility] Details    [facility] Details    [facility] Details    [facility] Details    [facility] Details    [facility] Details    [facility] Details    [facility] Details    [facility] Details    [facility] Details    [facility] Details    [facility] Details    [facility] Details    [facility] Details    [facility] Details    [facility] Details    [facility]

## 2020-01-03 NOTE — ED Notes (Signed)
The pts boyfriend has called and will call back after  We have a fetal heart  tone

## 2020-01-03 NOTE — ED Notes (Signed)
Dr Luanna Cole notified about the pts behavior
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
## 2020-01-03 NOTE — ED Notes (Signed)
Pt has been seen by ob rapid response fetal heart rate identified.  Pt refusing to eat  The food provided for her but does not like anyhting mentioned  Being difficult

## 2020-01-03 NOTE — ED Notes (Signed)
The pt is c/o lower pelvic pain  edp notified the pain just started after she ate a honey bun

## 2020-01-03 NOTE — ED Notes (Signed)
Pt c/o being hungry not wanting what we have to offer  Just woke up from a nap
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
## 2020-01-03 NOTE — ED Notes (Signed)
Patient valuables given to security.   Patient belongings in [place] [address] in purple zone (ED).

## 2020-01-03 NOTE — Progress Notes (Signed)
This CSW received call from [facility] inquiring on further needs of pt. CSW advised [facility] that this CSW is unsure as pt is not at [facility]. CSW provided ED CSW for [facility] Pontes with contact information for [facility] [phone] to better assist with pt's needs.      [name] Vickie Melnik, MSW, LCSW [facility] [place] [facility] [place] [phone]

## 2020-01-03 NOTE — ED Notes (Addendum)
ot asking to speak to the doctor and the charge nurse she wants to leave over food

## 2020-01-03 NOTE — ED Notes (Addendum)
The pt is very hostile  I attempted to get a fetal  Heart tone and she became upset because she reports that she is allergic to ky jelly and the ultrasound jelly    Rapid ob response is coming down  To attempt a fetal  Heart tone

## 2020-01-03 NOTE — TOC Progression Note (Signed)
Transition of Care [facility]) - Progression Note    Patient Details  Name: Tina Wood MRN: [id number] Date of Birth: 04-01-1991  Transition of Care [facility]) CM/SW [place], [place] Phone Number: 01/03/2020, 2:48 PM  Clinical Narrative:     ED CSW received call form Jeanette Caprice informing of call she received from [facility] inquiring on further needs of pt. And needing a Psychiatrist note vs Nurse Practitioner. Contact info received for [place] at [facility] [phone].   CSW attempted to call contact at [place] phone busy. Attempted to contact 2x but phone busy, unable to leave message.        Expected Discharge Plan and Services                                                 Social Determinants of Health (SDOH) Interventions    Readmission Risk Interventions No flowsheet data found.

## 2020-01-03 NOTE — ED Provider Notes (Signed)
Emergency Medicine Observation Re-evaluation Note  Tina Wood is a 29 y.o. female, seen on rounds today.  Pt initially presented to the ED for complaints of Medical Clearance  Currently, the patient is sleeping, resting comfortably. She is [redacted] weeks pregnant.  Physical Exam  BP 109/76 (BP Location: Right Arm)   Pulse 80   Temp 98 F (36.7 C) (Oral)   Resp 16   Ht 5\' 7"  (1.702 m)   Wt 127 kg   LMP 09/12/2019   BMI 43.85 kg/m  Physical Exam Vitals and nursing note reviewed.  Constitutional:      General: She is not in acute distress.    Appearance: She is well-developed.  HENT:     Head: Normocephalic and atraumatic.  Abdominal:     Comments: Gravid abdomen    ED Course / MDM  EKG:    I have reviewed the labs performed to date as well as medications administered while in observation.    Plan  Current plan is for TTS assessment.  Patient is not under full IVC at this time.   Leafy Kindle 01/03/20 1212    Carmin Muskrat, MD 01/04/20 [phone]
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
## 2020-01-03 NOTE — ED Notes (Signed)
Pt asked for  Med for a migraine headache   That she reports she has had all day but nobody gave her anything.  She will not drink water  Tylenol was given for her headache

## 2020-01-03 NOTE — ED Notes (Signed)
While getting report from Delmar Landau., RN, pt in room throwing things, demanding chicken tenders and fries and a dessert and yelling. Pt threw cup of ice water across the room and spilled everywhere. Security called to bedside. Sitter on phone with kitchen ordering pt special dinner. Asked sitter to hang up, that pt is not to have special treatment. Pt states that she is allergic to "everything" and that she can only eat certain things. In to see pt. Told pt that she is not going to act like this tonight. Introduced myself to pt and told her that I would do what I could to help her tonight, but she was going to have to change her attitude. Pt then sat on the bed with her fingers in her ears humming.This RN told pt that I will be back to talk to her after finishing report.

## 2020-01-03 NOTE — MAU Note (Signed)
EMS brought pt to MAU by stretcher. Pt was picked up on side of road talking to [employer]. She requested transport by EMS due to "pregnancy complications". Boyfriend called [employer] due to stating pt wanted to hurt herself.  Pt denied that to EMS. Pt told EMS she is in manic -depressive state. Having abd pain

## 2020-01-04 ENCOUNTER — Other Ambulatory Visit: Payer: Self-pay

## 2020-01-04 MED ORDER — HYDROXYZINE HCL 25 MG PO TABS
25.0000 mg | ORAL_TABLET | Freq: Three times a day (TID) | ORAL | Status: DC | PRN
  Administered 2020-01-05 (×2): 25 mg via ORAL
  Filled 2020-01-04 (×3): qty 1

## 2020-01-04 MED ORDER — LURASIDONE HCL 40 MG PO TABS
60.0000 mg | ORAL_TABLET | Freq: Every day | ORAL | Status: DC
  Administered 2020-01-04: 60 mg via ORAL
  Filled 2020-01-04: qty 2

## 2020-01-04 NOTE — ED Notes (Signed)
Dr Roslynn Amble in w/pt.

## 2020-01-04 NOTE — ED Notes (Addendum)
Pt took [place] after stating she needed more to eat because she is still hungry and states "I need to eat 360 calories when I take Latuda". Pt ate 75% of dinner. Pt given apple sauce x 2 as requested. Pt refused to take any other meds. Offered pt Zofran as she had stated she was nauseated earlier - Pt declined. Pt has been asking for "Percocet" and "Flomax" repeatedly d/t states "It's the only thing that will help my kidney stone. I am prescribed this and I have missed 2 doses already. It's the only thing that will help me". Pt refusing to take Tylenol offered x 3 this shift. Pt appears to be nad.

## 2020-01-04 NOTE — Progress Notes (Signed)
Patient ID: Tina Wood, female   DOB: [birth year], 29 y.o.   MRN: [id number]  I spoke to Dr. Mallie Darting as it was noted that patient is manic. Her psychiatric history is significant for Bipolar disorder. Per chart review, patient was previously prescribed Latuda. It was indicated that patient was to restart this medication, on 11/07/2019 and prescription was sent to her pharmacy. I relayed this information to Dr. Mallie Darting as I suspected that patient should be able to resume this medication while pregnant. Per Dr. Mallie Darting, this medication can be given safely during pregnancy. I have reorder Latuda 60 mg daily at supper. Per chart review, patient was also prescribed Vistaril after consulting with Dr Elly Modena,  OB/Gyn. I have reordered this medication as well.

## 2020-01-04 NOTE — ED Notes (Signed)
SI/Vol/Inpt tx  Breakfast Tray Ordered

## 2020-01-04 NOTE — ED Notes (Addendum)
Pt ate 100% of lunch. Pt states "I am supposed to be on Percocet and Flomax" - RN noted this was given as one time doses at MAU - does not need to be ordered at this time per Dr Roslynn Amble.

## 2020-01-04 NOTE — ED Notes (Addendum)
Pt noted to be very irritable, argumentative, yelling, cursing. States "My baby isn't moving because I'm not eating". States she is not eating the food provided d/t she does not like it. Advised pt no special meals are ordered and RN will add "pork" to her allergy list d/t she states she does not eat it. Pt threw meal tray off her lap and into the floor - pt then refused to pick it up. States "No one is addressing the reason I came here". However, pt will only state reason for coming to ED is d/t unable to feel fetal movement. Advised pt she was cleared by MAU - states "I wasn't feeling my baby move then either and y'all don't care!" Pt denies SI/HI and states she wants to leave. Advised pt waiting for TTS. Pt yelled at RN stating "Why didn't you wake me up so I could have talked with them earlier?!"

## 2020-01-04 NOTE — ED Notes (Addendum)
Pt noted to be yelling and cursing - states "I have lost a lot of fluid" and "my water could have broken and y'all don't care". Pt refused to have So-Hi performed. Pt refused to get up from stretcher so RN could see if fluid noted on stretcher as she says she is sitting in fluid. Pt yelling at staff "You're fucking retarded!" Pt then got up from stretcher and closed door to room. No fluid noted on stretcher. Blinds opened so may view pt.

## 2020-01-04 NOTE — ED Notes (Signed)
Si/Vol/Inpt tx Dinner ordered

## 2020-01-04 NOTE — ED Notes (Signed)
Pt continues to attempt to be argumentative w/staff - Pt states she wants to speak w/SW so she can leave. Pt noted to be talking in loud voice and refusing to lower voice even after much encouragement.
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
## 2020-01-04 NOTE — ED Notes (Signed)
Pt refusing po meds at this time. Will re-attempt.

## 2020-01-04 NOTE — ED Notes (Signed)
FHR 159 - Right mid to lower abd. Pt tolerated well. 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 Pt asking for Percocet - advised pt she may have Tylenol as per Dr Roslynn Amble - pt declined. Pt nad.

## 2020-01-04 NOTE — ED Notes (Signed)
Telepsych being performed. 

## 2020-01-05 DIAGNOSIS — F3130 Bipolar disorder, current episode depressed, mild or moderate severity, unspecified: Secondary | ICD-10-CM

## 2020-01-05 NOTE — ED Notes (Signed)
Pt initially refused to give verbal permission to Thedora Hinders, [facility] NP to contact Chauncey Cruel, Grandfather, for collateral. Then Pt called him from phone at desk and requested for J Lord to speak w/him so may obtain collateral.

## 2020-01-05 NOTE — ED Notes (Signed)
Pt laying in bed, regular rise and fall of breath noted. Will continue to monitor.
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
## 2020-01-05 NOTE — ED Notes (Signed)
Atarax given as requested w/Tyelnol. Stated she is feeling fetal movement this am. Stated "that's what woke me up and when I heard the tele machine thing". Thedora Hinders, BH NP, in w/pt.

## 2020-01-05 NOTE — ED Notes (Signed)
Pt called grandparents from ride from ED. Pt initially stated they will not be able to pick her up until later. Pt then called them back and they advised will be able to come get her this afternoon. Pt requesting to be d/c'd and will sit outside of ED while waiting for them. Pt voiced understanding she may not wait in lobby d/t COVID-19. Pt eating lunch. ALL belongings - 1 labeled belongings bag and 1 valuables envelope - returned to pt - Pt signed verifying all items present. D/C instructions given and discussed - pt voiced understanding.

## 2020-01-05 NOTE — ED Notes (Addendum)
Pt awake - ambulated to bathroom then to nurses' desk - stating she slept last night and is now ready to go home. States "They told me if I took the [place] and didn't have any outbursts, I could go home today". States she did not have any outbursts last night - "I didn't slam the door. I fell up against it". Also states her Sprite fell on the floor d/t got caught on bed rail. Admits to having a disagreement w/another pt d/t states "he told me shut the F--- up". Pt noted to be more calm than previously and is able to carry on conversation. States she will need assistance w/transportaton home d/t her grandparents will not be able to come get her from [place]. Offered pt breakfast tray - pt initially declined then took sausage patty and banana off tray. Sprite given as requested. Pt requesting Tylenol for "my kidney stone pain".

## 2020-01-05 NOTE — ED Notes (Signed)
Joey, SW, in w/pt.  

## 2020-01-05 NOTE — ED Notes (Signed)
Breakfast Ordered 

## 2020-01-05 NOTE — Discharge Instructions (Signed)
[facility] [address]  [phone] Living With Depression Everyone experiences occasional disappointment, sadness, and loss in their lives. When you are feeling down, blue, or sad for at least 2 weeks in a row, it may mean that you have depression. Depression can affect your thoughts and feelings, relationships, daily activities, and physical health. It is caused by changes in the way your brain functions. If you receive a diagnosis of depression, your health care provider will tell you which type of depression you have and what treatment options are available to you. If you are living with depression, there are ways to help you recover from it and also ways to prevent it from coming back. How to cope with lifestyle changes Coping with stress     Stress is your bodys reaction to life changes and events, both good and bad. Stressful situations may include:  Getting married.  The death of a spouse.  Losing a job.  Retiring.  Having a baby. Stress can last just a few hours or it can be ongoing. Stress can play a major role in depression, so it is important to learn both how to cope with stress and how to think about it differently. Talk with your health care provider or a counselor if you would like to learn more about stress reduction. He or she may suggest some stress reduction techniques, such as:  Music therapy. This can include creating music or listening to music. Choose music that you enjoy and that inspires you.  Mindfulness-based meditation. This kind of meditation can be done while sitting or walking. It involves being aware of your normal breaths, rather than trying to control your breathing.  Centering prayer. This is a kind of meditation that involves focusing on a spiritual word or phrase. Choose a word, phrase, or sacred image that is meaningful to you and that brings you peace.  Deep breathing. To do this, expand your stomach and inhale slowly  through your nose. Hold your breath for 3-5 seconds, then exhale slowly, allowing your stomach muscles to relax.  Muscle relaxation. This involves intentionally tensing muscles then relaxing them. Choose a stress reduction technique that fits your lifestyle and personality. Stress reduction techniques take time and practice to develop. Set aside 5-15 minutes a day to do them. Therapists can offer training in these techniques. The training may be covered by some insurance plans. Other things you can do to manage stress include:  Keeping a stress diary. This can help you learn what triggers your stress and ways to control your response.  Understanding what your limits are and saying no to requests or events that lead to a schedule that is too full.  Thinking about how you respond to certain situations. You may not be able to control everything, but you can control how you react.  Adding humor to your life by watching funny films or TV shows.  Making time for activities that help you relax and not feeling guilty about spending your time this way.  Medicines Your health care provider may suggest certain medicines if he or she feels that they will help improve your condition. Avoid using alcohol and other substances that may prevent your medicines from working properly (may interact). It is also important to:  Talk with your pharmacist or health care provider about all the medicines that you take, their possible side effects, and what medicines are safe to take together.  Make it your goal to take part  in all treatment decisions (shared decision-making). This includes giving input on the side effects of medicines. It is best if shared decision-making with your health care provider is part of your total treatment plan. If your health care provider prescribes a medicine, you may not notice the full benefits of it for 4-8 weeks. Most people who are treated for depression need to be on medicine for at  least 6-12 months after they feel better. If you are taking medicines as part of your treatment, do not stop taking medicines without first talking to your health care provider. You may need to have the medicine slowly decreased (tapered) over time to decrease the risk of harmful side effects. Relationships Your health care provider may suggest family therapy along with individual therapy and drug therapy. While there may not be family problems that are causing you to feel depressed, it is still important to make sure your family learns as much as they can about your mental health. Having your familys support can help make your treatment successful. How to recognize changes in your condition Everyone has a different response to treatment for depression. Recovery from major depression happens when you have not had signs of major depression for two months. This may mean that you will start to:  Have more interest in doing activities.  Feel less hopeless than you did 2 months ago.  Have more energy.  Overeat less often, or have better or improving appetite.  Have better concentration. Your health care provider will work with you to decide the next steps in your recovery. It is also important to recognize when your condition is getting worse. Watch for these signs:  Having fatigue or low energy.  Eating too much or too little.  Sleeping too much or too little.  Feeling restless, agitated, or hopeless.  Having trouble concentrating or making decisions.  Having unexplained physical complaints.  Feeling irritable, angry, or aggressive. Get help as soon as you or your family members notice these symptoms coming back. How to get support and help from others How to talk with friends and family members about your condition  Talking to friends and family members about your condition can provide you with one way to get support and guidance. Reach out to trusted friends or family members,  explain your symptoms to them, and let them know that you are working with a health care provider to treat your depression. Financial resources Not all insurance plans cover mental health care, so it is important to check with your insurance carrier. If paying for co-pays or counseling services is a problem, search for a local or county mental health care center. They may be able to offer public mental health care services at low or no cost when you are not able to see a private health care provider. If you are taking medicine for depression, you may be able to get the generic form, which may be less expensive. Some makers of prescription medicines also offer help to patients who cannot afford the medicines they need. Follow these instructions at home:   Get the right amount and quality of sleep.  Cut down on using caffeine, tobacco, alcohol, and other potentially harmful substances.  Try to exercise, such as walking or lifting small weights.  Take over-the-counter and prescription medicines only as told by your health care provider.  Eat a healthy diet that includes plenty of vegetables, fruits, whole grains, low-fat dairy products, and lean protein. Do not eat a lot of  foods that are high in solid fats, added sugars, or salt.  Keep all follow-up visits as told by your health care provider. This is important. Contact a health care provider if:  You stop taking your antidepressant medicines, and you have any of these symptoms: ? Nausea. ? Headache. ? Feeling lightheaded. ? Chills and body aches. ? Not being able to sleep (insomnia).  You or your friends and family think your depression is getting worse. Get help right away if:  You have thoughts of hurting yourself or others. If you ever feel like you may hurt yourself or others, or have thoughts about taking your own life, get help right away. You can go to your nearest emergency department or call:  Your local emergency services  (911 in the [place]  A suicide crisis helpline, such as the [place] at (619)149-4933. This is open 24-hours a day. Summary  If you are living with depression, there are ways to help you recover from it and also ways to prevent it from coming back.  Work with your health care team to create a management plan that includes counseling, stress management techniques, and healthy lifestyle habits. This information is not intended to replace advice given to you by your health care provider. Make sure you discuss any questions you have with your health care provider. Document Revised: 12/07/2018 Document Reviewed: 07/18/2016 Elsevier Patient Education  [place].

## 2020-01-05 NOTE — Consult Note (Signed)
[facility] ED Discharge  01/05/2020 11:46 AM Tina Wood  MRN:  [id number] Principal Problem: Bipolar disorder, current episode depressed, mild or moderate severity, unspecified ([place]) Discharge Diagnoses: Principal Problem:   Bipolar disorder, current episode depressed, mild or moderate severity, unspecified ([place])  Subjective: "I'm better."  Patient seen and evaluated in person by this provider.  She reports she is better after sleeping and "getting some Latuda in my system."  Denies suicidal/homicidal ideations, hallucinations, mania symptoms, paranoia or other concerns.  Past suicide attempt 5 years ago.  Past use of substances none currently.  Lives with her grandparents and children, one and six year olds. She wants to go celebrate Mothers Day with them.  She also has a custody hearing tomorrow she wants to attend.  Pleasantly working puzzles on her bed, watching television. Provided the number to her grandfather, Chauncey Cruel, for collateral information.  He had no safety concerns, witnessed call by RN, Eber Hong.  Dr Mallie Darting notified of the assessment at 1135 who reviewed the chart and concurs with the plan to discharge.  Total Time spent with patient: 1 hour  Past Psychiatric History: bipolar d/o, substance abuse  Past Medical History:  Past Medical History:  Diagnosis Date  . Asthma   . Brain tumor (benign) ([place])   . Bronchiolitis   . COPD (chronic obstructive pulmonary disease) ([place])   . Seizures ([place])     Past Surgical History:  Procedure Laterality Date  . SKIN GRAFT    . WRIST SURGERY     Family History:  Family History  Family history unknown: Yes   Family Psychiatric  History: none Social History:  Social History   Substance and Sexual Activity  Alcohol Use No     Social History   Substance and Sexual Activity  Drug Use No    Social History   Socioeconomic History  . Marital status: Single    Spouse name: Not on file  . Number of children: Not  on file  . Years of education: Not on file  . Highest education level: Not on file  Occupational History  . Not on file  Tobacco Use  . Smoking status: Current Every Day Smoker    Packs/day: 0.50    Years: 9.00    Pack years: 4.50    Types: Cigarettes  . Smokeless tobacco: Former Systems developer    Types: Snuff  Substance and Sexual Activity  . Alcohol use: No  . Drug use: No  . Sexual activity: Yes    Birth control/protection: None  Other Topics Concern  . Not on file  Social History Narrative  . Not on file   Social Determinants of Health   Financial Resource Strain: Medium Risk  . Difficulty of Paying Living Expenses: Somewhat hard  Food Insecurity: Food Insecurity Present  . Worried About Charity fundraiser in the Last Year: Sometimes true  . Ran Out of Food in the Last Year: Sometimes true  Transportation Needs: Unknown  . Lack of Transportation (Medical): No  . Lack of Transportation (Non-Medical): Not on file  Physical Activity:   . Days of Exercise per Week:   . Minutes of Exercise per Session:   Stress: Stress Concern Present  . Feeling of Stress : Very much  Social Connections:   . Frequency of Communication with Friends and Family:   . Frequency of Social Gatherings with Friends and Family:   . Attends Religious Services:   . Active Member of Clubs or Organizations:   .  Attends Archivist Meetings:   Marland Kitchen Marital Status:     Has this patient used any form of tobacco in the last 30 days? (Cigarettes, Smokeless Tobacco, Cigars, and/or Pipes) NA  Current Medications: Current Facility-Administered Medications  Medication Dose Route Frequency Provider Last Rate Last Admin  . acetaminophen (TYLENOL) tablet 650 mg  650 mg Oral [id number] PRN Delora Fuel, MD   [id number] mg at 01/05/20 1045  . alum & mag hydroxide-simeth (MAALOX/MYLANTA) 200-200-20 MG/5ML suspension 30 mL  30 mL Oral [id number] PRN Delora Fuel, MD      . hydrOXYzine (ATARAX/VISTARIL) tablet 25 mg  25 mg Oral TID  PRN Mordecai Maes, NP   25 mg at 01/05/20 1045  . lurasidone (LATUDA) tablet 60 mg  60 mg Oral Q supper Mordecai Maes, NP   60 mg at 01/04/20 1758  . nicotine (NICODERM CQ - dosed in mg/24 hr) patch 7 mg  7 mg Transdermal Daily Delora Fuel, MD   Stopped at 01/03/20 2100  . ondansetron (ZOFRAN) tablet 4 mg  4 mg Oral [id number] PRN Delora Fuel, MD   4 mg at 01/05/20 [id number]   Current Outpatient Medications  Medication Sig Dispense Refill  . Acetaminophen (TYLENOL PO) Take 1-2 tablets by mouth daily as needed (Moderate pain).    . Elastic Bandages & Supports (COMFORT FIT MATERNITY SUPP LG) MISC 1 Units by Does not apply route daily. (Patient not taking: Reported on 12/19/2019) 1 each 0  . oxyCODONE-acetaminophen (PERCOCET/ROXICET) 5-325 MG tablet Take 2 tablets by mouth every 6 (six) hours as needed for severe pain. 15 tablet 0  . tamsulosin (FLOMAX) 0.4 MG CAPS capsule Take 2 capsules (0.8 mg total) by mouth daily after breakfast. 30 capsule 0   PTA Medications: (Not in a hospital admission)   Musculoskeletal: Strength & Muscle Tone: within normal limits Gait & Station: normal Patient leans: N/A  Psychiatric Specialty Exam: Physical Exam Vitals and nursing note reviewed.  Constitutional:      Appearance: Normal appearance.  HENT:     Head: Normocephalic.     Nose: Nose normal.  Pulmonary:     Effort: Pulmonary effort is normal.  Musculoskeletal:        General: Normal range of motion.  Neurological:     General: No focal deficit present.     Mental Status: She is alert and oriented to person, place, and time.  Psychiatric:        Attention and Perception: Attention and perception normal.        Mood and Affect: Affect normal. Mood is anxious.        Speech: Speech normal.        Behavior: Behavior normal. Behavior is cooperative.        Thought Content: Thought content normal.        Cognition and Memory: Cognition and memory normal.        Judgment: Judgment normal.      Review of Systems  Psychiatric/Behavioral: The patient is nervous/anxious.   All other systems reviewed and are negative.   Blood pressure 102/62, pulse 61, temperature 98.4 F (36.9 C), temperature source Oral, resp. rate 16, height 5\' 7"  (1.702 m), weight 127 kg, last menstrual period 09/12/2019, SpO2 96 %, unknown if currently breastfeeding.Body mass index is 43.85 kg/m.  General Appearance: Casual  Eye Contact:  Good  Speech:  Normal Rate  Volume:  Normal  Mood:  Anxious  Affect:  Congruent  Thought Process:  Coherent and Descriptions of  Associations: Intact  Orientation:  Full (Time, Place, and Person)  Thought Content:  WDL and Logical  Suicidal Thoughts:  No  Homicidal Thoughts:  No  Memory:  Immediate;   Good Recent;   Good Remote;   Good  Judgement:  Fair  Insight:  Fair  Psychomotor Activity:  Normal  Concentration:  Concentration: Good and Attention Span: Good  Recall:  Good  Fund of Knowledge:  Good  Language:  Good  Akathisia:  No  Handed:  Right  AIMS (if indicated):     Assets:  Housing Leisure Time Physical Health Resilience Social Support  ADL's:  Intact  Cognition:  WNL  Sleep:        Demographic Factors:  Caucasian  Loss Factors: NA  Historical Factors: Prior suicide attempts  Risk Reduction Factors:   Pregnancy, Responsible for children under 81 years of age, Sense of responsibility to family, Living with another person, especially a relative and Positive social support  Continued Clinical Symptoms:  Anxiety mild  Cognitive Features That Contribute To Risk:  None    Suicide Risk:  Minimal: No identifiable suicidal ideation.  Patients presenting with no risk factors but with morbid ruminations; may be classified as minimal risk based on the severity of the depressive symptoms    Plan Of Care/Follow-up recommendations:  Bipolar d/o most recent presentation of hypomania: -Follow up with [facility] for medication management Activity:   as tolerated Diet:  heart healthy diet  Disposition: discharge home Waylan Boga, NP 01/05/2020, 11:46 AM

## 2020-01-09 ENCOUNTER — Encounter: Payer: Medicaid Other | Admitting: Obstetrics and Gynecology

## 2020-01-13 ENCOUNTER — Other Ambulatory Visit: Payer: Self-pay

## 2020-01-13 ENCOUNTER — Encounter: Payer: Self-pay | Admitting: Obstetrics & Gynecology

## 2020-01-13 ENCOUNTER — Ambulatory Visit (INDEPENDENT_AMBULATORY_CARE_PROVIDER_SITE_OTHER): Payer: Medicaid Other | Admitting: Obstetrics & Gynecology

## 2020-01-13 VITALS — BP 111/73 | HR 75 | Wt 287.0 lb

## 2020-01-13 DIAGNOSIS — Z23 Encounter for immunization: Secondary | ICD-10-CM | POA: Diagnosis not present

## 2020-01-13 DIAGNOSIS — O350XX1 Maternal care for (suspected) central nervous system malformation in fetus, fetus 1: Secondary | ICD-10-CM

## 2020-01-13 DIAGNOSIS — J45909 Unspecified asthma, uncomplicated: Secondary | ICD-10-CM

## 2020-01-13 DIAGNOSIS — O99212 Obesity complicating pregnancy, second trimester: Secondary | ICD-10-CM

## 2020-01-13 DIAGNOSIS — IMO0002 Reserved for concepts with insufficient information to code with codable children: Secondary | ICD-10-CM

## 2020-01-13 DIAGNOSIS — M549 Dorsalgia, unspecified: Secondary | ICD-10-CM

## 2020-01-13 DIAGNOSIS — F319 Bipolar disorder, unspecified: Secondary | ICD-10-CM

## 2020-01-13 DIAGNOSIS — O99891 Other specified diseases and conditions complicating pregnancy: Secondary | ICD-10-CM

## 2020-01-13 DIAGNOSIS — O0992 Supervision of high risk pregnancy, unspecified, second trimester: Secondary | ICD-10-CM

## 2020-01-13 DIAGNOSIS — O99342 Other mental disorders complicating pregnancy, second trimester: Secondary | ICD-10-CM

## 2020-01-13 DIAGNOSIS — O99210 Obesity complicating pregnancy, unspecified trimester: Secondary | ICD-10-CM

## 2020-01-13 DIAGNOSIS — Z3A27 27 weeks gestation of pregnancy: Secondary | ICD-10-CM

## 2020-01-13 DIAGNOSIS — O350XX0 Maternal care for (suspected) central nervous system malformation in fetus, not applicable or unspecified: Secondary | ICD-10-CM

## 2020-01-13 DIAGNOSIS — O0990 Supervision of high risk pregnancy, unspecified, unspecified trimester: Secondary | ICD-10-CM

## 2020-01-13 DIAGNOSIS — O99512 Diseases of the respiratory system complicating pregnancy, second trimester: Secondary | ICD-10-CM

## 2020-01-13 LAB — COMPREHENSIVE METABOLIC PANEL
AG Ratio: 1.3 (calc) (ref 1.0–2.5)
ALT: 16 U/L (ref 6–29)
AST: 13 U/L (ref 10–30)
Albumin: 3.5 g/dL — ABNORMAL LOW (ref 3.6–5.1)
Alkaline phosphatase (APISO): 90 U/L (ref 31–125)
BUN/Creatinine Ratio: 11 (calc) (ref 6–22)
BUN: 5 mg/dL — ABNORMAL LOW (ref 7–25)
CO2: 24 mmol/L (ref 20–32)
Calcium: 8.7 mg/dL (ref 8.6–10.2)
Chloride: 105 mmol/L (ref 98–110)
Creat: 0.46 mg/dL — ABNORMAL LOW (ref 0.50–1.10)
Globulin: 2.7 g/dL (calc) (ref 1.9–3.7)
Glucose, Bld: 76 mg/dL (ref 65–99)
Potassium: 4.1 mmol/L (ref 3.5–5.3)
Sodium: 138 mmol/L (ref 135–146)
Total Bilirubin: 0.3 mg/dL (ref 0.2–1.2)
Total Protein: 6.2 g/dL (ref 6.1–8.1)

## 2020-01-13 MED ORDER — ALBUTEROL SULFATE HFA 108 (90 BASE) MCG/ACT IN AERS: 2.0000 | INHALATION_SPRAY | Freq: Four times a day (QID) | RESPIRATORY_TRACT | 2 refills | Status: AC | PRN

## 2020-01-13 MED ORDER — EPINEPHRINE 0.3 MG/0.3ML IJ SOAJ: 0.3000 mg | INTRAMUSCULAR | 2 refills | Status: AC | PRN

## 2020-01-13 MED ORDER — FLUTICASONE-SALMETEROL 100-50 MCG/DOSE IN AEPB: 1.0000 | INHALATION_SPRAY | Freq: Two times a day (BID) | RESPIRATORY_TRACT | 2 refills | Status: AC

## 2020-01-13 MED ORDER — MONTELUKAST SODIUM 10 MG PO TABS: 10.0000 mg | ORAL_TABLET | Freq: Every day | ORAL | 2 refills | Status: DC

## 2020-01-13 NOTE — Patient Instructions (Signed)
Return to office for any scheduled appointments. Call the office or go to the MAU at [facility] if:  You begin to have strong, frequent contractions  Your water breaks.  Sometimes it is a big gush of fluid, sometimes it is just a trickle that keeps getting your panties wet or running down your legs  You have vaginal bleeding.  It is normal to have a small amount of spotting if your cervix was checked.   You do not feel your baby moving like normal.  If you do not, get something to eat and drink and lay down and focus on feeling your baby move.   If your baby is still not moving like normal, you should call the office or go to MAU.  Any other obstetric concerns.   Third Trimester of Pregnancy The third trimester is from week 28 through week 40 (months 7 through 9). The third trimester is a time when the unborn baby (fetus) is growing rapidly. At the end of the ninth month, the fetus is about 20 inches in length and weighs 6-10 pounds. Body changes during your third trimester Your body will continue to go through many changes during pregnancy. The changes vary from woman to woman. During the third trimester:  Your weight will continue to increase. You can expect to gain 25-35 pounds (11-16 kg) by the end of the pregnancy.  You may begin to get stretch marks on your hips, abdomen, and breasts.  You may urinate more often because the fetus is moving lower into your pelvis and pressing on your bladder.  You may develop or continue to have heartburn. This is caused by increased hormones that slow down muscles in the digestive tract.  You may develop or continue to have constipation because increased hormones slow digestion and cause the muscles that push waste through your intestines to relax.  You may develop hemorrhoids. These are swollen veins (varicose veins) in the rectum that can itch or be painful.  You may develop swollen, bulging veins (varicose veins)  in your legs.  You may have increased body aches in the pelvis, back, or thighs. This is due to weight gain and increased hormones that are relaxing your joints.  You may have changes in your hair. These can include thickening of your hair, rapid growth, and changes in texture. Some women also have hair loss during or after pregnancy, or hair that feels dry or thin. Your hair will most likely return to normal after your baby is born.  Your breasts will continue to grow and they will continue to become tender. A yellow fluid (colostrum) may leak from your breasts. This is the first milk you are producing for your baby.  Your belly button may stick out.  You may notice more swelling in your hands, face, or ankles.  You may have increased tingling or numbness in your hands, arms, and legs. The skin on your belly may also feel numb.  You may feel short of breath because of your expanding uterus.  You may have more problems sleeping. This can be caused by the size of your belly, increased need to urinate, and an increase in your body's metabolism.  You may notice the fetus "dropping," or moving lower in your abdomen (lightening).  You may have increased vaginal discharge.  You may notice your joints feel loose and you may have pain around your pelvic bone. What to expect at prenatal visits You will have   prenatal exams every 2 weeks until week 36. Then you will have weekly prenatal exams. During a routine prenatal visit:  You will be weighed to make sure you and the baby are growing normally.  Your blood pressure will be taken.  Your abdomen will be measured to track your baby's growth.  The fetal heartbeat will be listened to.  Any test results from the previous visit will be discussed.  You may have a cervical check near your due date to see if your cervix has softened or thinned (effaced).  You will be tested for Group B streptococcus. This happens between 35 and 37 weeks. Your  health care provider may ask you:  What your birth plan is.  How you are feeling.  If you are feeling the baby move.  If you have had any abnormal symptoms, such as leaking fluid, bleeding, severe headaches, or abdominal cramping.  If you are using any tobacco products, including cigarettes, chewing tobacco, and electronic cigarettes.  If you have any questions. Other tests or screenings that may be performed during your third trimester include:  Blood tests that check for low iron levels (anemia).  Fetal testing to check the health, activity level, and growth of the fetus. Testing is done if you have certain medical conditions or if there are problems during the pregnancy.  Nonstress test (NST). This test checks the health of your baby to make sure there are no signs of problems, such as the baby not getting enough oxygen. During this test, a belt is placed around your belly. The baby is made to move, and its heart rate is monitored during movement. What is false labor? False labor is a condition in which you feel small, irregular tightenings of the muscles in the womb (contractions) that usually go away with rest, changing position, or drinking water. These are called Braxton Hicks contractions. Contractions may last for hours, days, or even weeks before true labor sets in. If contractions come at regular intervals, become more frequent, increase in intensity, or become painful, you should see your health care provider. What are the signs of labor?  Abdominal cramps.  Regular contractions that start at 10 minutes apart and become stronger and more frequent with time.  Contractions that start on the top of the uterus and spread down to the lower abdomen and back.  Increased pelvic pressure and dull back pain.  A watery or bloody mucus discharge that comes from the vagina.  Leaking of amniotic fluid. This is also known as your "water breaking." It could be a slow trickle or a gush.  Let your health care provider know if it has a color or strange odor. If you have any of these signs, call your health care provider right away, even if it is before your due date. Follow these instructions at home: Medicines  Follow your health care provider's instructions regarding medicine use. Specific medicines may be either safe or unsafe to take during pregnancy.  Take a prenatal vitamin that contains at least 600 micrograms (mcg) of folic acid.  If you develop constipation, try taking a stool softener if your health care provider approves. Eating and drinking   Eat a balanced diet that includes fresh fruits and vegetables, whole grains, good sources of protein such as meat, eggs, or tofu, and low-fat dairy. Your health care provider will help you determine the amount of weight gain that is right for you.  Avoid raw meat and uncooked cheese. These carry germs that   can cause birth defects in the baby.  If you have low calcium intake from food, talk to your health care provider about whether you should take a daily calcium supplement.  Eat four or five small meals rather than three large meals a day.  Limit foods that are high in fat and processed sugars, such as fried and sweet foods.  To prevent constipation: ? Drink enough fluid to keep your urine clear or pale yellow. ? Eat foods that are high in fiber, such as fresh fruits and vegetables, whole grains, and beans. Activity  Exercise only as directed by your health care provider. Most women can continue their usual exercise routine during pregnancy. Try to exercise for 30 minutes at least 5 days a week. Stop exercising if you experience uterine contractions.  Avoid heavy lifting.  Do not exercise in extreme heat or humidity, or at high altitudes.  Wear low-heel, comfortable shoes.  Practice good posture.  You may continue to have sex unless your health care provider tells you otherwise. Relieving pain and  discomfort  Take frequent breaks and rest with your legs elevated if you have leg cramps or low back pain.  Take warm sitz baths to soothe any pain or discomfort caused by hemorrhoids. Use hemorrhoid cream if your health care provider approves.  Wear a good support bra to prevent discomfort from breast tenderness.  If you develop varicose veins: ? Wear support pantyhose or compression stockings as told by your healthcare provider. ? Elevate your feet for 15 minutes, 3-4 times a day. Prenatal care  Write down your questions. Take them to your prenatal visits.  Keep all your prenatal visits as told by your health care provider. This is important. Safety  Wear your seat belt at all times when driving.  Make a list of emergency phone numbers, including numbers for family, friends, the hospital, and police and fire departments. General instructions  Avoid cat litter boxes and soil used by cats. These carry germs that can cause birth defects in the baby. If you have a cat, ask someone to clean the litter box for you.  Do not travel far distances unless it is absolutely necessary and only with the approval of your health care provider.  Do not use hot tubs, steam rooms, or saunas.  Do not drink alcohol.  Do not use any products that contain nicotine or tobacco, such as cigarettes and e-cigarettes. If you need help quitting, ask your health care provider.  Do not use any medicinal herbs or unprescribed drugs. These chemicals affect the formation and growth of the baby.  Do not douche or use tampons or scented sanitary pads.  Do not cross your legs for long periods of time.  To prepare for the arrival of your baby: ? Take prenatal classes to understand, practice, and ask questions about labor and delivery. ? Make a trial run to the hospital. ? Visit the hospital and tour the maternity area. ? Arrange for maternity or paternity leave through employers. ? Arrange for family and  friends to take care of pets while you are in the hospital. ? Purchase a rear-facing car seat and make sure you know how to install it in your car. ? Pack your hospital bag. ? Prepare the baby's nursery. Make sure to remove all pillows and stuffed animals from the baby's crib to prevent suffocation.  Visit your dentist if you have not gone during your pregnancy. Use a soft toothbrush to brush your teeth and be   gentle when you floss. Contact a health care provider if:  You are unsure if you are in labor or if your water has broken.  You become dizzy.  You have mild pelvic cramps, pelvic pressure, or nagging pain in your abdominal area.  You have lower back pain.  You have persistent nausea, vomiting, or diarrhea.  You have an unusual or bad smelling vaginal discharge.  You have pain when you urinate. Get help right away if:  Your water breaks before 37 weeks.  You have regular contractions less than 5 minutes apart before 37 weeks.  You have a fever.  You are leaking fluid from your vagina.  You have spotting or bleeding from your vagina.  You have severe abdominal pain or cramping.  You have rapid weight loss or weight gain.  You have shortness of breath with chest pain.  You notice sudden or extreme swelling of your face, hands, ankles, feet, or legs.  Your baby makes fewer than 10 movements in 2 hours.  You have severe headaches that do not go away when you take medicine.  You have vision changes. Summary  The third trimester is from week 28 through week 40, months 7 through 9. The third trimester is a time when the unborn baby (fetus) is growing rapidly.  During the third trimester, your discomfort may increase as you and your baby continue to gain weight. You may have abdominal, leg, and back pain, sleeping problems, and an increased need to urinate.  During the third trimester your breasts will keep growing and they will continue to become tender. A yellow  fluid (colostrum) may leak from your breasts. This is the first milk you are producing for your baby.  False labor is a condition in which you feel small, irregular tightenings of the muscles in the womb (contractions) that eventually go away. These are called Braxton Hicks contractions. Contractions may last for hours, days, or even weeks before true labor sets in.  Signs of labor can include: abdominal cramps; regular contractions that start at 10 minutes apart and become stronger and more frequent with time; watery or bloody mucus discharge that comes from the vagina; increased pelvic pressure and dull back pain; and leaking of amniotic fluid. This information is not intended to replace advice given to you by your health care provider. Make sure you discuss any questions you have with your health care provider. Document Revised: 12/06/2018 Document Reviewed: 09/20/2016 [employer] Patient Education  2020 [employer]  
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
## 2020-01-14 LAB — CBC
HCT: 37.2 % (ref 35.0–45.0)
Hemoglobin: 12.1 g/dL (ref 11.7–15.5)
MCH: 26.8 pg — ABNORMAL LOW (ref 27.0–33.0)
MCHC: 32.5 g/dL (ref 32.0–36.0)
MCV: 82.5 fL (ref 80.0–100.0)
MPV: 11.4 fL (ref 7.5–12.5)
Platelets: 275 10*3/uL (ref 140–400)
RBC: 4.51 10*6/uL (ref 3.80–5.10)
RDW: 12.9 % (ref 11.0–15.0)
WBC: 11.5 10*3/uL — ABNORMAL HIGH (ref 3.8–10.8)

## 2020-01-14 LAB — HIV ANTIBODY (ROUTINE TESTING W REFLEX): HIV 1&2 Ab, 4th Generation: NONREACTIVE

## 2020-01-14 LAB — 2HR GTT W 1 HR, CARPENTER, 75 G
Glucose, 1 Hr, Gest: 106 mg/dL (ref 65–179)
Glucose, 2 Hr, Gest: 103 mg/dL (ref 65–152)
Glucose, Fasting, Gest: 83 mg/dL (ref 65–91)

## 2020-01-14 LAB — RPR: RPR Ser Ql: NONREACTIVE

## 2020-01-30 ENCOUNTER — Encounter: Payer: Medicaid Other | Admitting: Obstetrics and Gynecology

## 2020-02-03 ENCOUNTER — Encounter: Payer: Self-pay | Admitting: *Deleted

## 2020-02-04 ENCOUNTER — Inpatient Hospital Stay (HOSPITAL_COMMUNITY)
Admission: AD | Admit: 2020-02-04 | Discharge: 2020-02-04 | Disposition: A | Discharge status: Home / Self Care | Payer: Medicaid Other | Attending: Family Medicine | Admitting: Family Medicine

## 2020-02-04 ENCOUNTER — Encounter (HOSPITAL_COMMUNITY): Payer: Self-pay | Admitting: Family Medicine

## 2020-02-04 ENCOUNTER — Inpatient Hospital Stay (HOSPITAL_BASED_OUTPATIENT_CLINIC_OR_DEPARTMENT_OTHER): Payer: Medicaid Other

## 2020-02-04 ENCOUNTER — Other Ambulatory Visit: Payer: Self-pay

## 2020-02-04 ENCOUNTER — Telehealth: Payer: Self-pay

## 2020-02-04 DIAGNOSIS — F17210 Nicotine dependence, cigarettes, uncomplicated: Secondary | ICD-10-CM | POA: Diagnosis not present

## 2020-02-04 DIAGNOSIS — O99343 Other mental disorders complicating pregnancy, third trimester: Secondary | ICD-10-CM

## 2020-02-04 DIAGNOSIS — Z3A30 30 weeks gestation of pregnancy: Secondary | ICD-10-CM | POA: Diagnosis not present

## 2020-02-04 DIAGNOSIS — F17200 Nicotine dependence, unspecified, uncomplicated: Secondary | ICD-10-CM

## 2020-02-04 DIAGNOSIS — G43909 Migraine, unspecified, not intractable, without status migrainosus: Secondary | ICD-10-CM | POA: Insufficient documentation

## 2020-02-04 DIAGNOSIS — J449 Chronic obstructive pulmonary disease, unspecified: Secondary | ICD-10-CM | POA: Insufficient documentation

## 2020-02-04 DIAGNOSIS — O99353 Diseases of the nervous system complicating pregnancy, third trimester: Secondary | ICD-10-CM | POA: Diagnosis not present

## 2020-02-04 DIAGNOSIS — O99213 Obesity complicating pregnancy, third trimester: Secondary | ICD-10-CM | POA: Diagnosis not present

## 2020-02-04 DIAGNOSIS — O99513 Diseases of the respiratory system complicating pregnancy, third trimester: Secondary | ICD-10-CM

## 2020-02-04 DIAGNOSIS — G43809 Other migraine, not intractable, without status migrainosus: Secondary | ICD-10-CM

## 2020-02-04 DIAGNOSIS — O2623 Pregnancy care for patient with recurrent pregnancy loss, third trimester: Secondary | ICD-10-CM

## 2020-02-04 DIAGNOSIS — O99333 Smoking (tobacco) complicating pregnancy, third trimester: Secondary | ICD-10-CM | POA: Diagnosis not present

## 2020-02-04 DIAGNOSIS — Z79899 Other long term (current) drug therapy: Secondary | ICD-10-CM | POA: Insufficient documentation

## 2020-02-04 DIAGNOSIS — O350XX0 Maternal care for (suspected) central nervous system malformation in fetus, not applicable or unspecified: Secondary | ICD-10-CM | POA: Diagnosis not present

## 2020-02-04 DIAGNOSIS — F99 Mental disorder, not otherwise specified: Secondary | ICD-10-CM

## 2020-02-04 DIAGNOSIS — O368130 Decreased fetal movements, third trimester, not applicable or unspecified: Secondary | ICD-10-CM

## 2020-02-04 DIAGNOSIS — O09293 Supervision of pregnancy with other poor reproductive or obstetric history, third trimester: Secondary | ICD-10-CM | POA: Insufficient documentation

## 2020-02-04 DIAGNOSIS — R32 Unspecified urinary incontinence: Secondary | ICD-10-CM | POA: Diagnosis not present

## 2020-02-04 DIAGNOSIS — G40909 Epilepsy, unspecified, not intractable, without status epilepticus: Secondary | ICD-10-CM

## 2020-02-04 DIAGNOSIS — J019 Acute sinusitis, unspecified: Secondary | ICD-10-CM

## 2020-02-04 DIAGNOSIS — IMO0002 Reserved for concepts with insufficient information to code with codable children: Secondary | ICD-10-CM

## 2020-02-04 LAB — AMNISURE RUPTURE OF MEMBRANE (ROM) NOT AT ARMC: Amnisure ROM: NEGATIVE

## 2020-02-04 MED ORDER — BUTALBITAL-APAP-CAFFEINE 50-325-40 MG PO TABS
2.0000 | ORAL_TABLET | Freq: Once | ORAL | Status: AC
  Administered 2020-02-04: 2 via ORAL
  Filled 2020-02-04: qty 2

## 2020-02-04 MED ORDER — BUTALBITAL-APAP-CAFFEINE 50-325-40 MG PO TABS: 1.0000 | ORAL_TABLET | Freq: Four times a day (QID) | ORAL | 0 refills | Status: DC | PRN

## 2020-02-04 MED ORDER — CETIRIZINE HCL 10 MG PO TABS
10.0000 mg | ORAL_TABLET | Freq: Every day | ORAL | 0 refills | Status: DC
Start: 2020-02-04 — End: 2020-03-26
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
## 2020-02-04 NOTE — MAU Note (Signed)
Pt reports DFM since Thursday.  Pt states that she went to [facility] in which they did not do anything and so she is here.
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
## 2020-02-04 NOTE — ED Notes (Signed)
Pt arrived in the ED at [facility] reporting contractions and no fetal movement since Thursday. She reported being seen at another facility since Thursday. Pt v/s 113/66 (77) hr 77 99% RA  Temp 98.7. Pt reported she was supposed to go to "L&D". Registration saw that she had a pre admission and did not register here in the ED. This RN called MAU and spoke to [place] to let her know that the pt was in the ED. Pt was transported to MAU.

## 2020-02-04 NOTE — Discharge Instructions (Signed)
Third Trimester of Pregnancy  The third trimester is from week 28 through week 40 (months 7 through 9). This trimester is when your unborn baby (fetus) is growing very fast. At the end of the ninth month, the unborn baby is about 20 inches in length. It weighs about 6-10 pounds. Follow these instructions at home: Medicines  Take over-the-counter and prescription medicines only as told by your doctor. Some medicines are safe and some medicines are not safe during pregnancy.  Take a prenatal vitamin that contains at least 600 micrograms (mcg) of folic acid.  If you have trouble pooping (constipation), take medicine that will make your stool soft (stool softener) if your doctor approves. Eating and drinking   Eat regular, healthy meals.  Avoid raw meat and uncooked cheese.  If you get low calcium from the food you eat, talk to your doctor about taking a daily calcium supplement.  Eat four or five small meals rather than three large meals a day.  Avoid foods that are high in fat and sugars, such as fried and sweet foods.  To prevent constipation: ? Eat foods that are high in fiber, like fresh fruits and vegetables, whole grains, and beans. ? Drink enough fluids to keep your pee (urine) clear or pale yellow. Activity  Exercise only as told by your doctor. Stop exercising if you start to have cramps.  Avoid heavy lifting, wear low heels, and sit up straight.  Do not exercise if it is too hot, too humid, or if you are in a place of great height (high altitude).  You may continue to have sex unless your doctor tells you not to. Relieving pain and discomfort  Wear a good support bra if your breasts are tender.  Take frequent breaks and rest with your legs raised if you have leg cramps or low back pain.  Take warm water baths (sitz baths) to soothe pain or discomfort caused by hemorrhoids. Use hemorrhoid cream if your doctor approves.  If you develop puffy, bulging veins (varicose  veins) in your legs: ? Wear support hose or compression stockings as told by your doctor. ? Raise (elevate) your feet for 15 minutes, 3-4 times a day. ? Limit salt in your food. Safety  Wear your seat belt when driving.  Make a list of emergency phone numbers, including numbers for family, friends, the hospital, and police and fire departments. Preparing for your baby's arrival To prepare for the arrival of your baby:  Take prenatal classes.  Practice driving to the hospital.  Visit the hospital and tour the maternity area.  Talk to your work about taking leave once the baby comes.  Pack your hospital bag.  Prepare the baby's room.  Go to your doctor visits.  Buy a rear-facing car seat. Learn how to install it in your car. General instructions  Do not use hot tubs, steam rooms, or saunas.  Do not use any products that contain nicotine or tobacco, such as cigarettes and e-cigarettes. If you need help quitting, ask your doctor.  Do not drink alcohol.  Do not douche or use tampons or scented sanitary pads.  Do not cross your legs for long periods of time.  Do not travel for long distances unless you must. Only do so if your doctor says it is okay.  Visit your dentist if you have not gone during your pregnancy. Use a soft toothbrush to brush your teeth. Be gentle when you floss.  Avoid cat litter boxes and soil  used by cats. These carry germs that can cause birth defects in the baby and can cause a loss of your baby (miscarriage) or stillbirth.  Keep all your prenatal visits as told by your doctor. This is important. Contact a doctor if:  You are not sure if you are in labor or if your water has broken.  You are dizzy.  You have mild cramps or pressure in your lower belly.  You have a nagging pain in your belly area.  You continue to feel sick to your stomach, you throw up, or you have watery poop.  You have bad smelling fluid coming from your vagina.  You have  pain when you pee. Get help right away if:  You have a fever.  You are leaking fluid from your vagina.  You are spotting or bleeding from your vagina.  You have severe belly cramps or pain.  You lose or gain weight quickly.  You have trouble catching your breath and have chest pain.  You notice sudden or extreme puffiness (swelling) of your face, hands, ankles, feet, or legs.  You have not felt the baby move in over an hour.  You have severe headaches that do not go away with medicine.  You have trouble seeing.  You are leaking, or you are having a gush of fluid, from your vagina before you are 37 weeks.  You have regular belly spasms (contractions) before you are 37 weeks. Summary  The third trimester is from week 28 through week 40 (months 7 through 9). This time is when your unborn baby is growing very fast.  Follow your doctor's advice about medicine, food, and activity.  Get ready for the arrival of your baby by taking prenatal classes, getting all the baby items ready, preparing the baby's room, and visiting your doctor to be checked.  Get help right away if you are bleeding from your vagina, or you have chest pain and trouble catching your breath, or if you have not felt your baby move in over an hour. This information is not intended to replace advice given to you by your health care provider. Make sure you discuss any questions you have with your health care provider. Document Revised: 12/06/2018 Document Reviewed: 09/20/2016 Elsevier Patient Education  [place]. Allergic Rhinitis, Adult Allergic rhinitis is an allergic reaction that affects the mucous membrane inside the nose. It causes sneezing, a runny or stuffy nose, and the feeling of mucus going down the back of the throat (postnasal drip). Allergic rhinitis can be mild to severe. There are two types of allergic rhinitis:  Seasonal. This type is also called hay fever. It happens only during certain  seasons.  Perennial. This type can happen at any time of the year. What are the causes? This condition happens when the body's defense system (immune system) responds to certain harmless substances called allergens as though they were germs.  Seasonal allergic rhinitis is triggered by pollen, which can come from grasses, trees, and weeds. Perennial allergic rhinitis may be caused by:  House dust mites.  Pet dander.  Mold spores. What are the signs or symptoms? Symptoms of this condition include:  Sneezing.  Runny or stuffy nose (nasal congestion).  Postnasal drip.  Itchy nose.  Tearing of the eyes.  Trouble sleeping.  Daytime sleepiness. How is this diagnosed? This condition may be diagnosed based on:  Your medical history.  A physical exam.  Tests to check for related conditions, such as: ? Asthma. ?  Pink eye. ? Ear infection. ? Upper respiratory infection.  Tests to find out which allergens trigger your symptoms. These may include skin or blood tests. How is this treated? There is no cure for this condition, but treatment can help control symptoms. Treatment may include:  Taking medicines that block allergy symptoms, such as antihistamines. Medicine may be given as a shot, nasal spray, or pill.  Avoiding the allergen.  Desensitization. This treatment involves getting ongoing shots until your body becomes less sensitive to the allergen. This treatment may be done if other treatments do not help.  If taking medicine and avoiding the allergen does not work, new, stronger medicines may be prescribed. Follow these instructions at home:  Find out what you are allergic to. Common allergens include smoke, dust, and pollen.  Avoid the things you are allergic to. These are some things you can do to help avoid allergens: ? Replace carpet with wood, tile, or vinyl flooring. Carpet can trap dander and dust. ? Do not smoke. Do not allow smoking in your home. ? Change  your heating and air conditioning filter at least once a month. ? During allergy season:  Keep windows closed as much as possible.  Plan outdoor activities when pollen counts are lowest. This is usually during the evening hours.  When coming indoors, change clothing and shower before sitting on furniture or bedding.  Take over-the-counter and prescription medicines only as told by your health care provider.  Keep all follow-up visits as told by your health care provider. This is important. Contact a health care provider if:  You have a fever.  You develop a persistent cough.  You make whistling sounds when you breathe (you wheeze).  Your symptoms interfere with your normal daily activities. Get help right away if:  You have shortness of breath. Summary  This condition can be managed by taking medicines as directed and avoiding allergens.  Contact your health care provider if you develop a persistent cough or fever.  During allergy season, keep windows closed as much as possible. This information is not intended to replace advice given to you by your health care provider. Make sure you discuss any questions you have with your health care provider. Document Revised: 07/28/2017 Document Reviewed: 09/22/2016 Elsevier Patient Education  2020 [employer].
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
## 2020-02-10 ENCOUNTER — Telehealth: Payer: Self-pay

## 2020-02-10 ENCOUNTER — Encounter: Payer: Self-pay | Admitting: *Deleted

## 2020-02-10 NOTE — Telephone Encounter (Signed)
Returned pt call. Pt was seen in ED last night and diagnosed with BV and UTI. Pt states her "vagina is raw and she is excruciating pain". Pt was told to take medications that were prescribed and she should start to see improvement. Pt states that she [facility] but, didn't have the [facility] for them so [facility]. Pt had Rx transferred to [facility] told her it will be three days until they can fill Rx. I asked pt if there was another pharmacy near her that she could get to and she states no that she has to wait for [facility]. Pt was told to pick up Rx as soon as she can and to take medication as directed. Pt expressed understanding.

## 2020-02-13 ENCOUNTER — Telehealth: Payer: Self-pay | Admitting: *Deleted

## 2020-02-13 NOTE — Telephone Encounter (Signed)
Attempted to return patient's call, message left with after hours line during lunch. Called patient twice at 1:48 PM, phone rings, and hangs up without a voicemail.

## 2020-02-18 ENCOUNTER — Other Ambulatory Visit: Payer: Self-pay

## 2020-02-18 ENCOUNTER — Ambulatory Visit: Payer: Medicaid Other | Admitting: *Deleted

## 2020-02-18 ENCOUNTER — Other Ambulatory Visit: Payer: Self-pay | Admitting: *Deleted

## 2020-02-18 ENCOUNTER — Ambulatory Visit (HOSPITAL_COMMUNITY): Payer: Medicaid Other | Attending: Obstetrics and Gynecology

## 2020-02-18 DIAGNOSIS — O99210 Obesity complicating pregnancy, unspecified trimester: Secondary | ICD-10-CM | POA: Diagnosis present

## 2020-02-18 DIAGNOSIS — O99343 Other mental disorders complicating pregnancy, third trimester: Secondary | ICD-10-CM

## 2020-02-18 DIAGNOSIS — O09293 Supervision of pregnancy with other poor reproductive or obstetric history, third trimester: Secondary | ICD-10-CM

## 2020-02-18 DIAGNOSIS — G40909 Epilepsy, unspecified, not intractable, without status epilepticus: Secondary | ICD-10-CM

## 2020-02-18 DIAGNOSIS — O350XX0 Maternal care for (suspected) central nervous system malformation in fetus, not applicable or unspecified: Secondary | ICD-10-CM | POA: Diagnosis present

## 2020-02-18 DIAGNOSIS — O99213 Obesity complicating pregnancy, third trimester: Secondary | ICD-10-CM

## 2020-02-18 DIAGNOSIS — O2623 Pregnancy care for patient with recurrent pregnancy loss, third trimester: Secondary | ICD-10-CM | POA: Diagnosis not present

## 2020-02-18 DIAGNOSIS — O99333 Smoking (tobacco) complicating pregnancy, third trimester: Secondary | ICD-10-CM

## 2020-02-18 DIAGNOSIS — IMO0002 Reserved for concepts with insufficient information to code with codable children: Secondary | ICD-10-CM

## 2020-02-18 DIAGNOSIS — O99350 Diseases of the nervous system complicating pregnancy, unspecified trimester: Secondary | ICD-10-CM

## 2020-02-18 DIAGNOSIS — Z3A32 32 weeks gestation of pregnancy: Secondary | ICD-10-CM

## 2020-02-18 DIAGNOSIS — Z6841 Body Mass Index (BMI) 40.0 and over, adult: Secondary | ICD-10-CM

## 2020-02-19 ENCOUNTER — Telehealth: Payer: Self-pay

## 2020-02-19 ENCOUNTER — Encounter: Payer: Medicaid Other | Admitting: Obstetrics and Gynecology
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
## 2020-02-20 ENCOUNTER — Encounter: Payer: Medicaid Other | Admitting: Obstetrics & Gynecology

## 2020-02-20 ENCOUNTER — Telehealth: Payer: Self-pay

## 2020-02-20 NOTE — Telephone Encounter (Signed)
Pt called to cancel appt due to not having a ride to the appointment. Appt rescheduled for 7/6.

## 2020-02-27 ENCOUNTER — Telehealth: Payer: Self-pay

## 2020-02-27 NOTE — Telephone Encounter (Signed)
Pt called to inform office that she no longer has a cell phone and she can't be contacted by our office and she can't contact us. Pt states she will continue to come to her appointments. I asked pt about her wellbeing and she said she is okay and will continue to come to appointments.

## 2020-03-03 ENCOUNTER — Encounter: Payer: Medicaid Other | Admitting: Advanced Practice Midwife

## 2020-03-06 ENCOUNTER — Encounter (HOSPITAL_COMMUNITY): Payer: Self-pay | Admitting: Obstetrics & Gynecology

## 2020-03-06 ENCOUNTER — Inpatient Hospital Stay (HOSPITAL_COMMUNITY)
Admission: AD | Admit: 2020-03-06 | Discharge: 2020-03-06 | Disposition: A | Discharge status: Home / Self Care | Payer: Medicaid Other | Attending: Obstetrics & Gynecology | Admitting: Obstetrics & Gynecology

## 2020-03-06 DIAGNOSIS — Z79899 Other long term (current) drug therapy: Secondary | ICD-10-CM | POA: Diagnosis not present

## 2020-03-06 DIAGNOSIS — O99333 Smoking (tobacco) complicating pregnancy, third trimester: Secondary | ICD-10-CM | POA: Diagnosis not present

## 2020-03-06 DIAGNOSIS — R109 Unspecified abdominal pain: Secondary | ICD-10-CM

## 2020-03-06 DIAGNOSIS — J449 Chronic obstructive pulmonary disease, unspecified: Secondary | ICD-10-CM | POA: Insufficient documentation

## 2020-03-06 DIAGNOSIS — Z7951 Long term (current) use of inhaled steroids: Secondary | ICD-10-CM | POA: Diagnosis not present

## 2020-03-06 DIAGNOSIS — Z888 Allergy status to other drugs, medicaments and biological substances status: Secondary | ICD-10-CM | POA: Insufficient documentation

## 2020-03-06 DIAGNOSIS — IMO0002 Reserved for concepts with insufficient information to code with codable children: Secondary | ICD-10-CM

## 2020-03-06 DIAGNOSIS — F17210 Nicotine dependence, cigarettes, uncomplicated: Secondary | ICD-10-CM | POA: Insufficient documentation

## 2020-03-06 DIAGNOSIS — S3991XA Unspecified injury of abdomen, initial encounter: Secondary | ICD-10-CM | POA: Diagnosis not present

## 2020-03-06 DIAGNOSIS — O350XX0 Maternal care for (suspected) central nervous system malformation in fetus, not applicable or unspecified: Secondary | ICD-10-CM

## 2020-03-06 DIAGNOSIS — O99513 Diseases of the respiratory system complicating pregnancy, third trimester: Secondary | ICD-10-CM | POA: Insufficient documentation

## 2020-03-06 DIAGNOSIS — O9A313 Physical abuse complicating pregnancy, third trimester: Secondary | ICD-10-CM | POA: Diagnosis not present

## 2020-03-06 DIAGNOSIS — R102 Pelvic and perineal pain: Secondary | ICD-10-CM

## 2020-03-06 DIAGNOSIS — Z3A34 34 weeks gestation of pregnancy: Secondary | ICD-10-CM | POA: Diagnosis not present

## 2020-03-06 DIAGNOSIS — R82998 Other abnormal findings in urine: Secondary | ICD-10-CM

## 2020-03-06 DIAGNOSIS — O99891 Other specified diseases and conditions complicating pregnancy: Secondary | ICD-10-CM

## 2020-03-06 LAB — URINALYSIS, ROUTINE W REFLEX MICROSCOPIC
Glucose, UA: NEGATIVE mg/dL
Hgb urine dipstick: NEGATIVE
Ketones, ur: NEGATIVE mg/dL
Nitrite: NEGATIVE
Protein, ur: 100 mg/dL — AB
Specific Gravity, Urine: 1.028 (ref 1.005–1.030)
pH: 5 (ref 5.0–8.0)

## 2020-03-06 MED ORDER — CEPHALEXIN 500 MG PO CAPS: 500.0000 mg | ORAL_CAPSULE | Freq: Four times a day (QID) | ORAL | 2 refills | Status: AC

## 2020-03-06 MED ORDER — OXYCODONE-ACETAMINOPHEN 5-325 MG PO TABS: 1.0000 | ORAL_TABLET | Freq: Once | ORAL | Status: DC

## 2020-03-06 NOTE — MAU Note (Signed)
Sandwich tray with graham crackers and PB given to pt. Pt and sign other with pt sharing food

## 2020-03-06 NOTE — MAU Note (Signed)
Pt offered hospital phone earlier to call for a ride. Has a cell phone but does not have block for charger. States hosp phone will not help as she will not have phone if she is waiting outside where it is dangerous. Pt now using hosp phone. Sign other with pt came out and requested to speak with house coverage again as SO was asleep when [facility] came down earlier to speak with pt. Lindie Spruce RN AC called.

## 2020-03-06 NOTE — Progress Notes (Signed)
I was asked by staff to speak to patient regarding transportation to [place].  Pt reported that she received a cab voucher to go to [place] 4 weeks ago. I advised the pt that a cab voucher to [place] is not an option and has never been an option.  I also advised that no resources were available to accommodate that request.  I advised the patient that she would not be able to remain inside the building once she was discharged. RN caring for the patient is aware of the information provided to the patient.
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
## 2020-03-06 NOTE — Discharge Instructions (Signed)
Blunt Abdominal Trauma  Blunt abdominal trauma is an injury in which the wall of the abdomen or the organs in the abdomen are damaged. These organs may include the liver or spleen. The damage can involve bruising, tearing, or rupture. A blunt trauma does not involve a puncture of the skin. Blunt abdominal trauma can range from mild to severe. In some cases, it can lead to severe swelling and irritation of the abdomen (peritonitis), severe bleeding, and a dangerous drop in blood pressure. What are the causes? This condition may be caused by a hard, direct hit to the abdomen. It can happen:  In a motor vehicle accident.  If you are kicked or punched in the abdomen.  If you fall from a height. What are the signs or symptoms? The main symptom of this condition is pain in the abdomen. The pain may spread to the back or shoulder. Other symptoms depend on the type and location of the injury. Symptoms can include:  Bruising.  Swelling.  Pain when pressing on the abdomen.  Blood in the urine.  Weakness.  Confusion.  Loss of consciousness.  Pale, dark (dusky), cool, or sweaty skin.  Vomiting blood.  Bloody stool or bleeding from the rectum.  Trouble breathing. Symptoms of this injury can develop suddenly or slowly. How is this diagnosed? This condition is diagnosed based on your symptoms and a physical exam. You may also have tests, including:  Blood tests.  Urine tests.  Imaging tests, such as: ? An ultrasound or CT scan of your abdomen. ? X-rays of your chest and abdomen. How is this treated? Treatment for this condition depends on what type of injury you have and how severe it is. Treatment may include:  Observation with repeated exams of the abdomen. This is done if the injury is mild.  Support for blood pressure and breathing.  Getting blood, fluids, medicine, or nutrition (total parenteral nutrition, TPN) through an IV.  Antibiotic medicine.  Insertion of a  tube: ? Through your nose and into your stomach (nasogastric tube). This may be called an NG tube. It removes stomach acids to allow your intestine to heal. ? In your bladder (indwelling catheter), if there is damage to the part of your body that normally drains urine from your bladder (urethra). Having this catheter allows your bladder to heal over time.  Blood transfusion.  A procedure to stop bleeding. This involves putting a long, thin tube (catheter) into one of your blood vessels (angiographic embolization).  Surgery to open up your abdomen to control bleeding or repair damage (laparotomy). This may be done if tests suggest that you have peritonitis or bleeding that cannot be controlled with angiographic embolization. Follow these instructions at home:  Take over-the-counter and prescription medicines only as told by your health care provider.  If you were prescribed an antibiotic medicine, use it as told by your health care provider. Do not stop taking the antibiotic even if you start to feel better.  Do not drive or use heavy machinery while taking prescription pain medicine.  If you are taking prescription pain medicine, take actions to prevent or treat constipation. Your health care provider may recommend that you: ? Drink enough fluid to keep your urine pale yellow. ? Eat foods that are high in fiber, such as fresh fruits and vegetables, whole grains, and beans. ? Limit foods that are high in fat and processed sugars, such as fried or sweet foods.  Do not use any products that contain  nicotine or tobacco, such as cigarettes and e-cigarettes. These can delay healing after an injury. If you need help quitting, ask your health care provider.  Return to your normal activities only when told by your health care provider. Ask your health care provider what activities are safe for you.  Keep all follow-up visits as told by your health care provider. This is important. Follow-up visits  may include counseling. Get help right away if:  Your symptoms return.  You have any of these: ? Blood in your urine or your bowel movements (stool). ? Pain in your abdomen. ? Trouble with breathing. ? Chest pain. ? A fever. ? Dizziness.  You vomit blood.  You pass out. These symptoms may represent a serious problem that is an emergency. Do not wait to see if the symptoms will go away. Get medical help right away. Call your local emergency services (911 in the [place] Do not drive yourself to the hospital. Summary  Blunt abdominal trauma is an injury that involves damage to the wall of the abdomen or the organs in the abdomen, such as the liver or spleen. It can be caused by a hard, direct hit to the abdomen.  Symptoms include pain in the abdomen. The pain can spread to the back or shoulder. Symptoms may also include bruising, swelling, blood in the urine or stool, difficulty breathing, weakness, confusion, or loss of consciousness.  Treatment for this condition depends on what type of injury you have and how severe it is.  Make sure you know which symptoms should cause you to get help right away. This information is not intended to replace advice given to you by your health care provider. Make sure you discuss any questions you have with your health care provider. Document Revised: 07/28/2017 Document Reviewed: 07/05/2017 Elsevier Patient Education  [place].

## 2020-03-06 NOTE — Progress Notes (Signed)
Written and verbal d/c instructions given and understanding voiced. Pt states she will not be able to get Keflex script filled. Understands her copay is $3. She was pd first of month and states money went to pay bills.

## 2020-03-06 NOTE — MAU Note (Signed)
PT request cab voucher. Told pt we do not give cab vouchers and bus does not run during the night. Pt states she lives in [place] and is not going to leave the hospital to be out on street in [place]. Requests to talk with someone who can help her get home. Melvenia Beam RN [facility] called and will come talk with pt.

## 2020-03-06 NOTE — MAU Note (Signed)
PT offered percocet for pain but she does not want to take that. "It is dangerous for the baby and for someone who has trouble with narcotics I do not want that" Tina Wood CNM aware and in to offer pt Flexeril and pt refused.
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
## 2020-03-06 NOTE — MAU Note (Addendum)
PT called out and asked for EFM to be removed. " I feel like the baby is fine and I cannot get comfortable with this (EFM) on my stomach. I need to lay on my side due to my sciatic nerve pain and my legs will go numb" Tina Wood CNM aware

## 2020-03-06 NOTE — MAU Note (Signed)
J Goble RN AC in with security to speak with SO of pt. They spoke briefly and then pt and SO left with security in attendance

## 2020-03-06 NOTE — MAU Note (Signed)
Tina Sarna RN [facility] in to discuss with pt regarding transportation to [facility]. Pt told we do not do cab vouchers and she cannot wait in the lobby due to covid. IF pt refuses to leave security will be called.

## 2020-03-07 LAB — CULTURE, OB URINE

## 2020-03-13 ENCOUNTER — Ambulatory Visit (INDEPENDENT_AMBULATORY_CARE_PROVIDER_SITE_OTHER): Payer: Medicaid Other

## 2020-03-13 ENCOUNTER — Other Ambulatory Visit (HOSPITAL_COMMUNITY)
Admission: RE | Admit: 2020-03-13 | Discharge: 2020-03-13 | Disposition: A | Discharge status: Home / Self Care | Payer: Medicaid Other | Source: Ambulatory Visit

## 2020-03-13 ENCOUNTER — Other Ambulatory Visit: Payer: Self-pay

## 2020-03-13 VITALS — BP 116/68 | HR 84 | Wt 283.0 lb

## 2020-03-13 DIAGNOSIS — Z3480 Encounter for supervision of other normal pregnancy, unspecified trimester: Secondary | ICD-10-CM

## 2020-03-13 DIAGNOSIS — F319 Bipolar disorder, unspecified: Secondary | ICD-10-CM

## 2020-03-13 DIAGNOSIS — Z3A35 35 weeks gestation of pregnancy: Secondary | ICD-10-CM

## 2020-03-13 DIAGNOSIS — O99213 Obesity complicating pregnancy, third trimester: Secondary | ICD-10-CM

## 2020-03-13 DIAGNOSIS — O99333 Smoking (tobacco) complicating pregnancy, third trimester: Secondary | ICD-10-CM

## 2020-03-13 DIAGNOSIS — O99343 Other mental disorders complicating pregnancy, third trimester: Secondary | ICD-10-CM

## 2020-03-13 NOTE — Patient Instructions (Signed)
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
## 2020-03-13 NOTE — Progress Notes (Signed)
Pt went to hospital on Tuesday but left AMA

## 2020-03-16 ENCOUNTER — Telehealth: Payer: Self-pay | Admitting: *Deleted

## 2020-03-16 NOTE — Telephone Encounter (Signed)
Returned call from 11:24 AM. Left patient a message to call the pharmacy for refill request, they will send to [place], and it will be returned to them.

## 2020-03-17 ENCOUNTER — Other Ambulatory Visit: Payer: Self-pay

## 2020-03-17 ENCOUNTER — Ambulatory Visit: Payer: Medicaid Other | Attending: Obstetrics and Gynecology

## 2020-03-17 ENCOUNTER — Ambulatory Visit: Payer: Medicaid Other | Admitting: *Deleted

## 2020-03-17 ENCOUNTER — Ambulatory Visit (INDEPENDENT_AMBULATORY_CARE_PROVIDER_SITE_OTHER): Payer: Medicaid Other | Admitting: Advanced Practice Midwife

## 2020-03-17 ENCOUNTER — Encounter: Payer: Self-pay | Admitting: *Deleted

## 2020-03-17 VITALS — BP 110/66 | HR 90 | Wt 282.0 lb

## 2020-03-17 DIAGNOSIS — Z3A36 36 weeks gestation of pregnancy: Secondary | ICD-10-CM | POA: Insufficient documentation

## 2020-03-17 DIAGNOSIS — O99350 Diseases of the nervous system complicating pregnancy, unspecified trimester: Secondary | ICD-10-CM | POA: Insufficient documentation

## 2020-03-17 DIAGNOSIS — Z659 Problem related to unspecified psychosocial circumstances: Secondary | ICD-10-CM

## 2020-03-17 DIAGNOSIS — G40909 Epilepsy, unspecified, not intractable, without status epilepticus: Secondary | ICD-10-CM

## 2020-03-17 DIAGNOSIS — O350XX0 Maternal care for (suspected) central nervous system malformation in fetus, not applicable or unspecified: Secondary | ICD-10-CM | POA: Diagnosis present

## 2020-03-17 DIAGNOSIS — O99213 Obesity complicating pregnancy, third trimester: Secondary | ICD-10-CM

## 2020-03-17 DIAGNOSIS — O99353 Diseases of the nervous system complicating pregnancy, third trimester: Secondary | ICD-10-CM

## 2020-03-17 DIAGNOSIS — Z6841 Body Mass Index (BMI) 40.0 and over, adult: Secondary | ICD-10-CM | POA: Insufficient documentation

## 2020-03-17 DIAGNOSIS — F329 Major depressive disorder, single episode, unspecified: Secondary | ICD-10-CM | POA: Insufficient documentation

## 2020-03-17 DIAGNOSIS — O99210 Obesity complicating pregnancy, unspecified trimester: Secondary | ICD-10-CM | POA: Insufficient documentation

## 2020-03-17 DIAGNOSIS — O0993 Supervision of high risk pregnancy, unspecified, third trimester: Secondary | ICD-10-CM | POA: Diagnosis not present

## 2020-03-17 DIAGNOSIS — O99343 Other mental disorders complicating pregnancy, third trimester: Secondary | ICD-10-CM

## 2020-03-17 DIAGNOSIS — F17200 Nicotine dependence, unspecified, uncomplicated: Secondary | ICD-10-CM | POA: Diagnosis not present

## 2020-03-17 DIAGNOSIS — O99340 Other mental disorders complicating pregnancy, unspecified trimester: Secondary | ICD-10-CM | POA: Insufficient documentation

## 2020-03-17 DIAGNOSIS — IMO0002 Reserved for concepts with insufficient information to code with codable children: Secondary | ICD-10-CM

## 2020-03-17 DIAGNOSIS — O2620 Pregnancy care for patient with recurrent pregnancy loss, unspecified trimester: Secondary | ICD-10-CM | POA: Diagnosis not present

## 2020-03-17 DIAGNOSIS — F99 Mental disorder, not otherwise specified: Secondary | ICD-10-CM

## 2020-03-17 DIAGNOSIS — F410 Panic disorder [episodic paroxysmal anxiety] without agoraphobia: Secondary | ICD-10-CM | POA: Insufficient documentation

## 2020-03-17 DIAGNOSIS — O2623 Pregnancy care for patient with recurrent pregnancy loss, third trimester: Secondary | ICD-10-CM

## 2020-03-17 DIAGNOSIS — E669 Obesity, unspecified: Secondary | ICD-10-CM

## 2020-03-17 DIAGNOSIS — O99333 Smoking (tobacco) complicating pregnancy, third trimester: Secondary | ICD-10-CM

## 2020-03-17 LAB — CULTURE, BETA STREP (GROUP B ONLY)
MICRO NUMBER:: 10714817
SPECIMEN QUALITY:: ADEQUATE

## 2020-03-17 LAB — CERVICOVAGINAL ANCILLARY ONLY
Chlamydia: NEGATIVE
Comment: NEGATIVE
Comment: NEGATIVE
Comment: NORMAL
Neisseria Gonorrhea: NEGATIVE
Trichomonas: NEGATIVE

## 2020-03-17 NOTE — Patient Instructions (Signed)
Labor Precautions Reasons to come to MAU at [facility]:  1.  Contractions are  5 minutes apart or less, each last 1 minute, these have been going on for 1-2 hours, and you cannot walk or talk during them 2.  You have a large gush of fluid, or a trickle of fluid that will not stop and you have to wear a pad 3.  You have bleeding that is bright red, heavier than spotting--like menstrual bleeding (spotting can be normal in early labor or after a check of your cervix) 4.  You do not feel the baby moving like he/she normally does  
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
## 2020-03-19 ENCOUNTER — Telehealth: Payer: Self-pay | Admitting: *Deleted

## 2020-03-19 NOTE — Telephone Encounter (Signed)
I received email from Velma our after hours call service that patient has been calling during the evenings and is cursing at the [facility] staff.    I called patient to see how we can help her and to discuss the concerns that [employer] has with her calls.   Patient states she did not call, but did state that she had called while walking to the hospital which is an eleven hour walk for her.  She has transportation issues and declines to call EMS because they will not take her to [facility] since she lives in [place].    [facility] provides transportation to her for her office visits.   [place] requested that we quit calling her since we cannot help her.

## 2020-03-26 ENCOUNTER — Telehealth: Payer: Self-pay

## 2020-03-26 DIAGNOSIS — J45909 Unspecified asthma, uncomplicated: Secondary | ICD-10-CM

## 2020-03-26 DIAGNOSIS — O26893 Other specified pregnancy related conditions, third trimester: Secondary | ICD-10-CM

## 2020-03-26 DIAGNOSIS — A749 Chlamydial infection, unspecified: Secondary | ICD-10-CM

## 2020-03-26 DIAGNOSIS — R12 Heartburn: Secondary | ICD-10-CM

## 2020-03-26 DIAGNOSIS — Z3483 Encounter for supervision of other normal pregnancy, third trimester: Secondary | ICD-10-CM

## 2020-03-26 DIAGNOSIS — O99512 Diseases of the respiratory system complicating pregnancy, second trimester: Secondary | ICD-10-CM

## 2020-03-26 MED ORDER — CETIRIZINE HCL 10 MG PO TABS: 10.0000 mg | ORAL_TABLET | Freq: Every day | ORAL | 0 refills | Status: AC

## 2020-03-26 MED ORDER — MONTELUKAST SODIUM 10 MG PO TABS: 10.0000 mg | ORAL_TABLET | Freq: Every day | ORAL | 0 refills | Status: AC

## 2020-03-26 MED ORDER — BUTALBITAL-APAP-CAFFEINE 50-325-40 MG PO TABS: 1.0000 | ORAL_TABLET | Freq: Four times a day (QID) | ORAL | 0 refills | Status: DC | PRN

## 2020-03-26 MED ORDER — AZITHROMYCIN 250 MG PO TABS: ORAL_TABLET | ORAL | 0 refills | Status: AC

## 2020-03-26 MED ORDER — FAMOTIDINE 20 MG PO TABS: 20.0000 mg | ORAL_TABLET | Freq: Two times a day (BID) | ORAL | 0 refills | Status: AC

## 2020-03-26 MED ORDER — BUTALBITAL-APAP-CAFFEINE 50-325-40 MG PO TABS: 1.0000 | ORAL_TABLET | Freq: Four times a day (QID) | ORAL | 0 refills | Status: AC | PRN

## 2020-03-26 NOTE — Telephone Encounter (Addendum)
Messages sent to pt's pharmacy per Len Blalock, CNM  ----- Message from Wende Mott, CNM sent at 03/26/2020  2:05 PM EDT ----- Regarding: RE: Medications She can have all of that. Azithromycin 1000mg  one time. She can have a 30 day supply of both Zyrtec and Singulair. She can have 20 fioricet and Pepcid 20mg  BID.   Thank you for doing that ----- Message ----- From: Lyndal Rainbow, CMA Sent: 03/26/2020   1:59 PM EDT To: Wende Mott, CNM Subject: Medications                                    Tina Wood,  You see this patient tomorrow. This pt was seen at [facility] and tested positive for Chlamydia 5 days ago. [facility] told her they sent medication to her pharmacy but, pt is stating pharmacy doesn't have Rx. She is requesting Rx from you. She is also requesting refill on Singulair, Fioricet, Zyrtec and she is requesting a Rx to help with heartburn. I just wanted to check with you on all of this.  Please advise.  Thanks.

## 2020-03-27 ENCOUNTER — Telehealth: Payer: Self-pay

## 2020-03-27 NOTE — Telephone Encounter (Signed)
Attempted to call pt to check on her and reschedule appt that she missed today. Pt did not answer. VM left asking pt to call office. Office number provided.

## 2020-04-02 ENCOUNTER — Encounter: Payer: Self-pay | Admitting: *Deleted

## 2020-04-06 ENCOUNTER — Telehealth: Payer: Self-pay | Admitting: *Deleted

## 2020-04-06 NOTE — Telephone Encounter (Signed)
Left patient an urgent message with scheduled appointments.

## 2020-04-09 ENCOUNTER — Telehealth (INDEPENDENT_AMBULATORY_CARE_PROVIDER_SITE_OTHER): Payer: Medicaid Other | Admitting: Obstetrics and Gynecology

## 2020-04-09 ENCOUNTER — Encounter: Payer: Self-pay | Admitting: Obstetrics and Gynecology

## 2020-04-09 DIAGNOSIS — F319 Bipolar disorder, unspecified: Secondary | ICD-10-CM

## 2020-04-09 NOTE — Progress Notes (Signed)
    GYNECOLOGY VIRTUAL VISIT ENCOUNTER NOTE  Provider location: [facility] for [place] at [place]   I connected with Tina Wood on 04/09/20 at  2:30 PM EDT by MyChart Video Encounter at home and verified that I am speaking with the correct person using two identifiers.   I discussed the limitations, risks, security and privacy concerns of performing an evaluation and management service virtually and the availability of in person appointments. I also discussed with the patient that there may be a patient responsible charge related to this service. The patient expressed understanding and agreed to proceed.   History:  Tina Wood is a 29 y.o. [id number] female being evaluated today for medication change. She is s/p 1CS at [facility] on 04/02/20 for footling breech presentation in labor. She is requesting going back on her [place]. She was discharged on Haldol and the haldol makes her feel like she is drunk and shaking all of the time. She does not want to take Haldol anymore and would like to go back on [place]. Is no longer breastfeeding.       Past Medical History:  Diagnosis Date  . Asthma   . Brain tumor (benign) ([place])   . Bronchiolitis   . COPD (chronic obstructive pulmonary disease) ([place])   . Seizures ([place])    Past Surgical History:  Procedure Laterality Date  . SKIN GRAFT    . WRIST SURGERY     The following portions of the patient's history were reviewed and updated as appropriate: allergies, current medications, past family history, past medical history, past social history, past surgical history and problem list.   Review of Systems:  Pertinent items noted in HPI and remainder of comprehensive ROS otherwise negative.  Physical Exam:   General:  Alert, oriented and cooperative. Patient appears to be in no acute distress.  Mental Status: Normal mood and affect. Normal behavior. Normal judgment and thought content.   Respiratory: Normal respiratory effort,  no problems with respiration noted  Rest of physical exam deferred due to type of encounter  Labs and Imaging No results found for this or any previous visit (from the past 336 hour(s)).   Assessment and Plan:     1. Bipolar 1 disorder ([place]) - On haldol, requesting to go back on latuda - Will defer med change to psychiatry/BH as she has not been on this in a long time - Pt upset that she has a long wait at [facility] to be seen, will have her see [place] until she can get back in to [place], she is agreeable to this plan  2. Postpartum state   I discussed the assessment and treatment plan with the patient. The patient was provided an opportunity to ask questions and all were answered. The patient agreed with the plan and demonstrated an understanding of the instructions.   The patient was advised to call back or seek an in-person evaluation/go to the ED if the symptoms worsen or if the condition fails to improve as anticipated.  I provided 12 minutes of face-to-face time during this encounter.   Sloan Leiter, MD [facility] for [place], [place]

## 2020-04-13 NOTE — BH Specialist Note (Deleted)
Integrated Behavioral Health via Telemedicine Video (Caregility) Visit  04/13/2020 Tina Wood Tina Wood [id number]  Number of [place] visits: 1(2 total) Session Start time: 1:45***  Session End time: 2:45*** Total time: {IBH Total Time:21014050} minutes  Referring Provider: Silas [place], MD Type of Visit: Video Patient/Family location: Home [facility] Provider location: [facility] for [place] at [facility]  All persons participating in visit: Patient *** and [place] ***   Discussed confidentiality: Yes   I connected with Tina Fredderick Erb by a video enabled telemedicine application ([place]) and verified that I am speaking with the correct person using two identifiers.    I discussed that engaging in this virtual visit, they consent to the provision of behavioral healthcare and the services will be billed under their insurance.   Patient and/or legal guardian expressed understanding and consented to virtual visit: Yes   PRESENTING CONCERNS: Patient and/or family reports the following symptoms/concerns: *** Duration of problem: ***; Severity of problem: {Mild/Moderate/Severe:20260}  STRENGTHS (Protective Factors/Coping Skills): ***  GOALS ADDRESSED: Patient will: 1.  Reduce symptoms of: {IBH Symptoms:21014056}  2.  Increase knowledge and/or ability of: {IBH Patient Tools:21014057}  3.  Demonstrate ability to: {IBH Goals:21014053}  INTERVENTIONS: Interventions utilized:  {IBH Interventions:21014054} Standardized Assessments completed: {IBH Screening Tools:21014051}  ASSESSMENT: Patient currently experiencing ***.   Patient may benefit from psychoeducation and brief therapeutic interventions regarding coping with symptoms of *** .  PLAN: 1. Follow up with behavioral health clinician on : *** 2. Behavioral recommendations:  -*** -*** 3. Referral(s): {IBH Referrals:21014055}  I discussed the  assessment and treatment plan with the patient and/or parent/guardian. They were provided an opportunity to ask questions and all were answered. They agreed with the plan and demonstrated an understanding of the instructions.   They were advised to call back or seek an in-person evaluation if the symptoms worsen or if the condition fails to improve as anticipated.   Confirmed patient's address: Yes  Confirmed patient's phone number: Yes  Any changes to demographics: No   Confirmed patient's insurance: Yes  Any changes to patient's insurance: No   I discussed the limitations of evaluation and management by telemedicine and the availability of in person appointments.  I discussed that the purpose of this visit is to provide behavioral health care while limiting exposure to the novel coronavirus.   Discussed there is a possibility of technology failure and discussed alternative modes of communication if that failure occurs.  Caroleen Hamman Bryne Lindon

## 2020-04-14 ENCOUNTER — Encounter: Payer: Medicaid Other | Admitting: Advanced Practice Midwife

## 2020-04-17 ENCOUNTER — Ambulatory Visit: Payer: Medicaid Other

## 2020-04-20 ENCOUNTER — Other Ambulatory Visit: Payer: Self-pay

## 2020-04-20 ENCOUNTER — Encounter: Payer: Self-pay | Admitting: Obstetrics and Gynecology

## 2020-04-20 ENCOUNTER — Ambulatory Visit (INDEPENDENT_AMBULATORY_CARE_PROVIDER_SITE_OTHER): Payer: Medicaid Other | Admitting: Obstetrics and Gynecology

## 2020-04-20 VITALS — BP 103/64 | HR 76 | Ht 68.0 in | Wt 260.0 lb

## 2020-04-20 DIAGNOSIS — Z5189 Encounter for other specified aftercare: Secondary | ICD-10-CM

## 2020-04-20 NOTE — Progress Notes (Signed)
PPD Scale: Score 11

## 2020-04-22 NOTE — Progress Notes (Signed)
[name] is a 29 y.o. female status post primary c-section at [facility] on 8/5. She is here for a wound check. She is feeling well, taking her medication as prescribed. She is helping with baby and feels safe in her living environment. She has no SS thoughts or ideations.    GENERAL: Well-developed, well-nourished female in no acute distress.  LUNGS: Effort normal SKIN: Warm, dry and without erythema PSYCH: Normal mood and affect ABDOMEN: Soft, incision clean, dry. No odor.     A:  Wound check Continue wound care at home.  Return next month for PP visit.   [name] I, NP 04/22/2020 8:16 AM

## 2020-04-30 ENCOUNTER — Ambulatory Visit: Payer: Medicaid Other | Admitting: Obstetrics and Gynecology

## 2020-05-11 ENCOUNTER — Telehealth: Payer: Self-pay

## 2020-05-11 ENCOUNTER — Encounter: Payer: Self-pay | Admitting: *Deleted

## 2020-05-11 NOTE — Telephone Encounter (Addendum)
Pt called earlier to schedule her postpartum appt that she was a no show for on 04/30/20. When I opened her chart, I saw that she had been dismissed from our practice. I asked pt to give me a few minutes and I would call her back. I then called my supervisor, Mellody Dance, who stated that she was dismissed and we were not scheduling her for any appointments. I called pt back and spoke with her about dismissal. I informed her that she could go back to where she delivered the baby and they could see her/get her set up with a new provider. Pt states she "can't go back to [place] or anywhere in [place]". Pt upset saying she is about to get kicked out of her house and she won't be on birth control. Pt was upset that she didn't know about this dismissal and I informed her that there was a letter in her MyChart letting her know. Pt states she cannot access MyChart. I read the letter to [facility] and tried to give her the phone number for the physician referral service and pt became upset that she did not want to see a new provider. Pt then hung up the phone call.

## 2020-05-11 NOTE — Patient Instructions (Signed)
Tina Wood is being discharged from the practice due to her excessive verbal abuse to our staff, providers, transportation team and after hours phone service.  Most interactions with Tina Wood, she has been aggressively verbally berating.  We will no longer be able to provide care to [name].

## 2021-01-26 IMAGING — MR MR ABDOMEN W/O CM
8 of 9 series · 39 of 48 positions shown · non-contrast
Comparison: None.

CLINICAL DATA: Right lower quadrant pain. Twenty-five weeks
pregnant.

EXAM:
MRI ABDOMEN AND PELVIS WITHOUT CONTRAST
TECHNIQUE: Multiplanar multisequence MR imaging of the abdomen and pelvis was
performed. No intravenous contrast was administered.

[Series 3: cor haste · coronal · 6.0mm · 1.41mm/px · 4 of 38 slices shown]
[im 1/38]
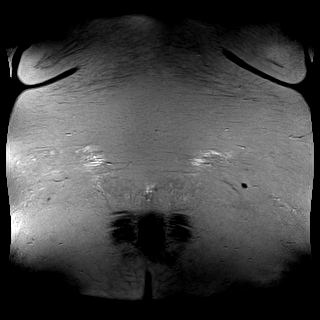
[im 13/38]
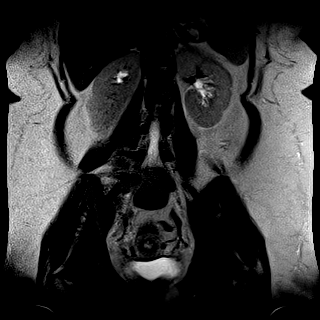
[im 25/38]
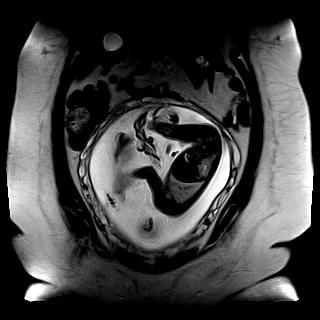
[im 38/38]
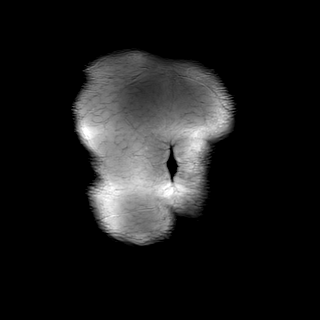

[Series 4: cor haste fs · coronal · 6.0mm · 1.25mm/px · 3 of 38 slices shown]
[im 1/38]
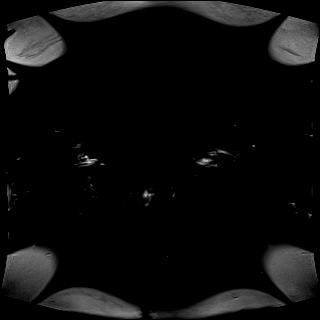
[im 19/38]
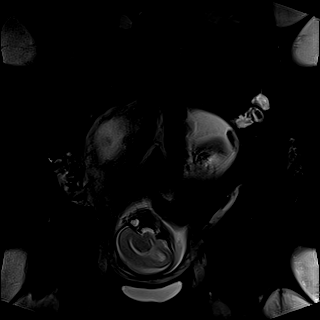
[im 38/38]
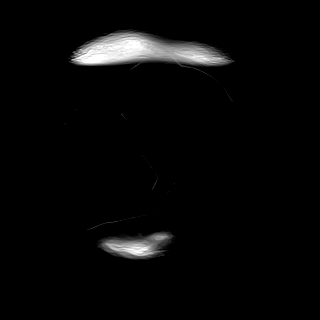

[Series 5: bSSFP · coronal · 6.0mm · 0.74mm/px · 3 of 38 slices shown (1 of 2)]
[im 1/38]
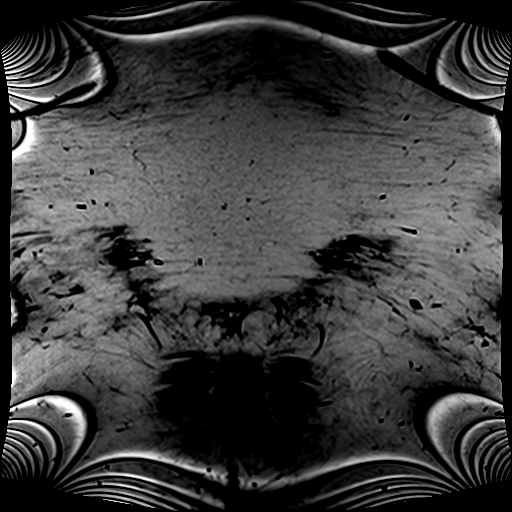
[im 19/38]
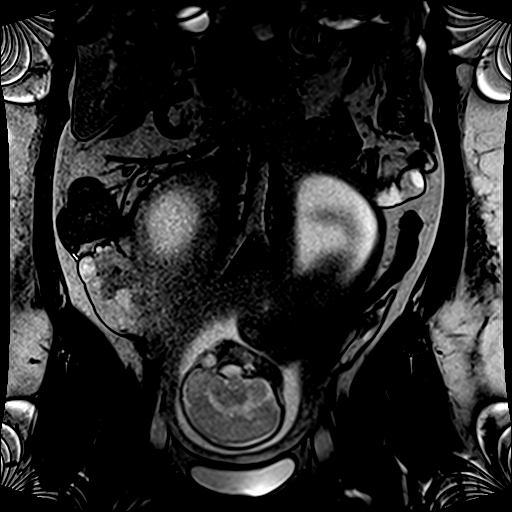
[im 38/38]
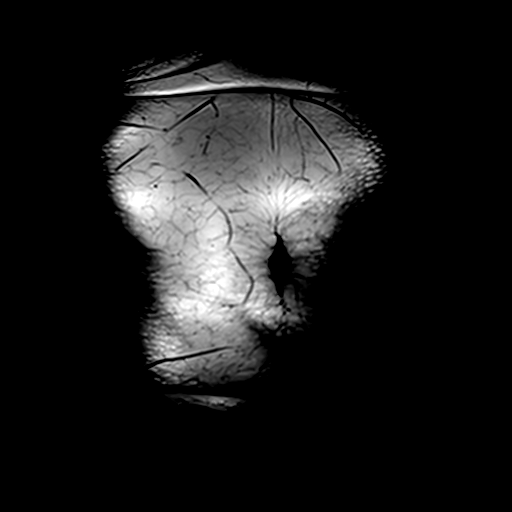

[Series 8: ax haste_comp · axial · 5.0mm · 1.41mm/px · z∈[-200,+225]mm · 6 of 72 slices shown]
[im 1/72]
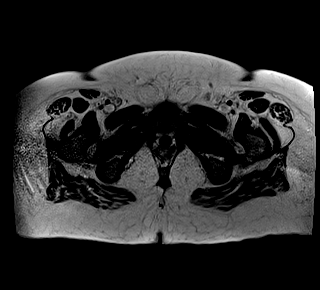
[im 15/72]
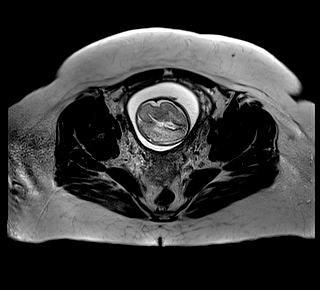
[im 29/72]
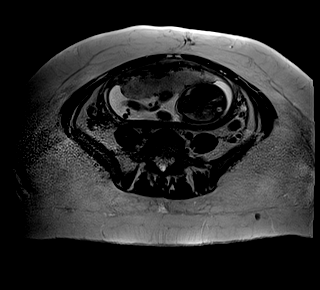
[im 43/72]
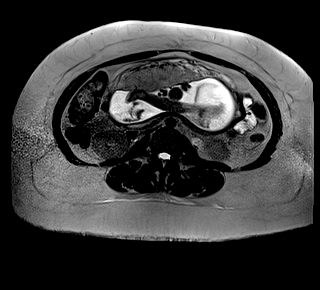
[im 57/72]
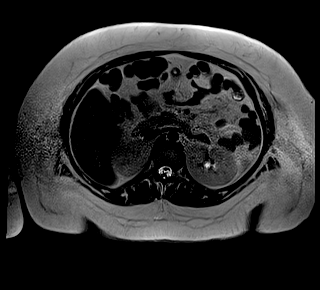
[im 72/72]
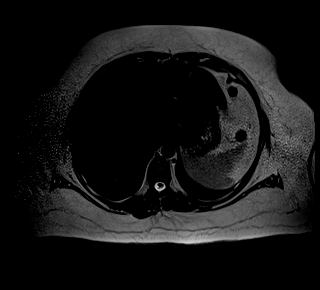

[Series 12: bSSFP · axial · 5.0mm · 0.88mm/px · z∈[-192,+254]mm · 6 of 76 slices shown (2 of 2)]
[im 1/76]
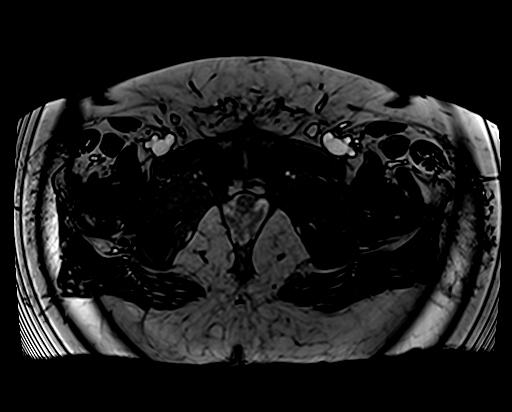
[im 16/76]
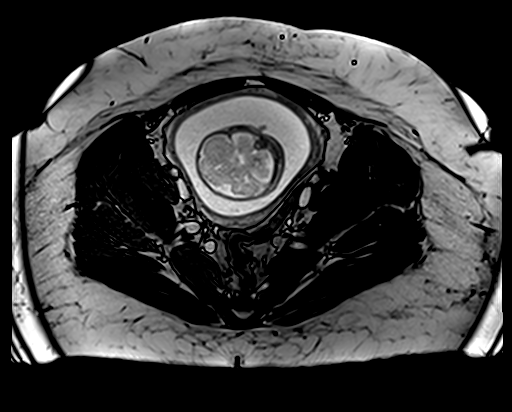
[im 31/76]
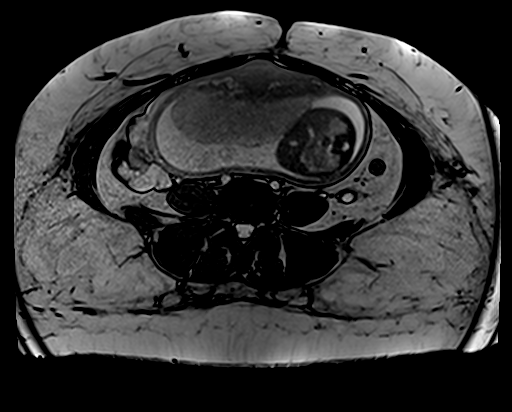
[im 46/76]
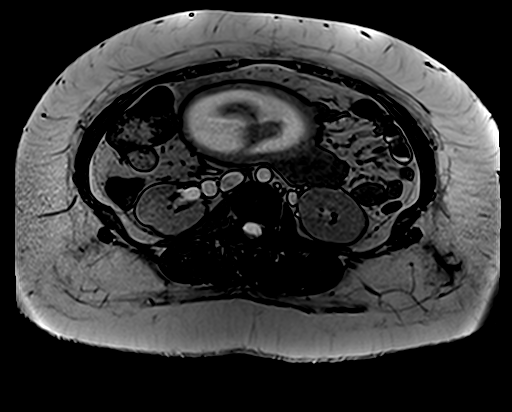
[im 61/76]
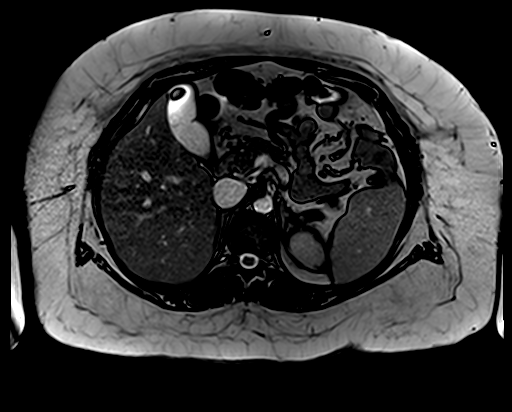
[im 76/76]
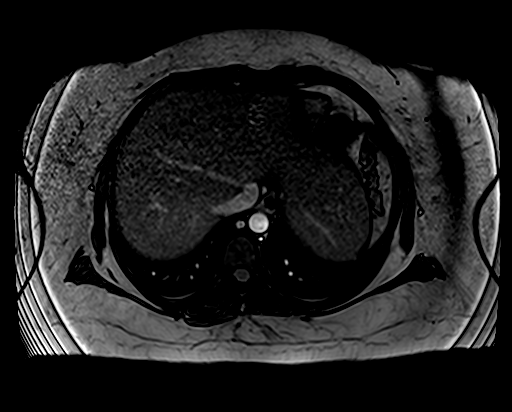

[Series 17: T2 fat-sat · axial · 5.0mm · 1.41mm/px · z∈[-200,+225]mm · 6 of 72 slices shown]
[im 1/72]
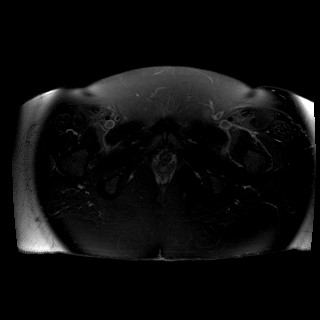
[im 15/72]
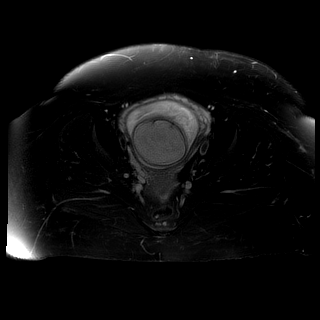
[im 29/72]
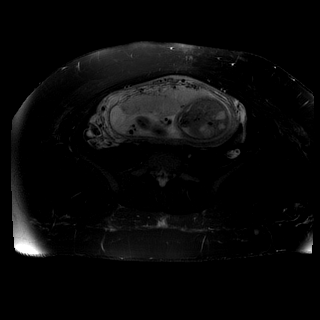
[im 43/72]
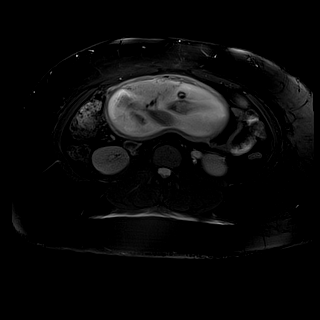
[im 57/72]
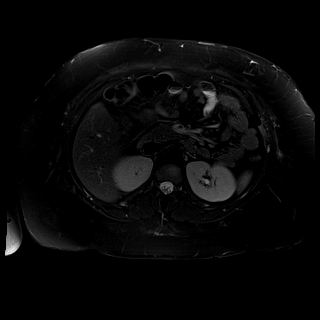
[im 72/72]
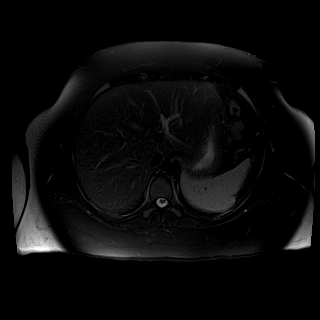

[Series 20: T1 · axial · 5.0mm · 1.76mm/px · z∈[-200,+225]mm · 6 of 72 slices shown]
[im 1/72]
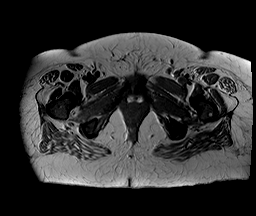
[im 15/72]
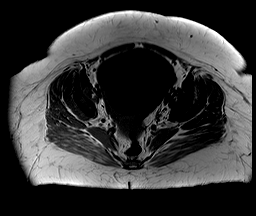
[im 29/72]
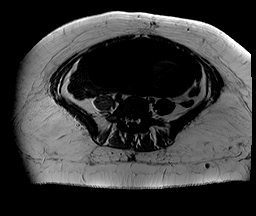
[im 43/72]
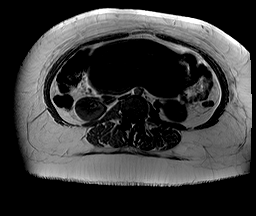
[im 57/72]
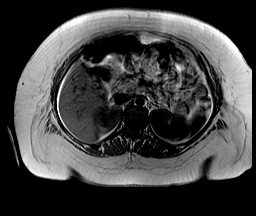
[im 72/72]
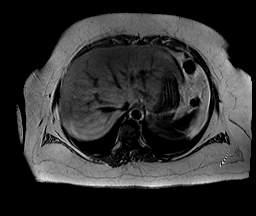

[Series 21: T1 dynamic · axial · 3.0mm · 1.41mm/px · z∈[+31,+187]mm · 5 of 80 slices shown]
[im 1/80]
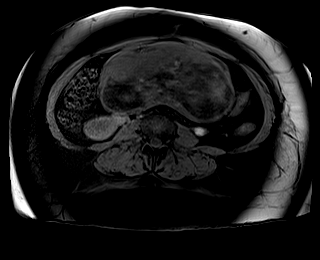
[im 14/80]
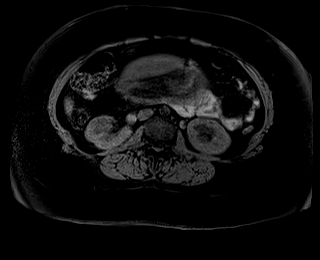
[im 27/80]
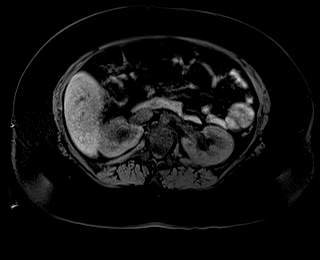
[im 40/80]
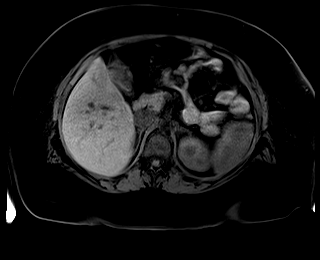
[im 53/80]
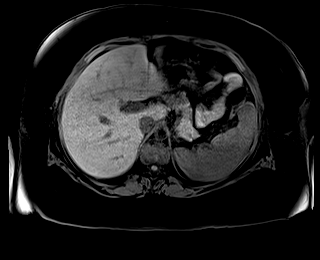

[39 of 48 positions shown; findings below may reference images not displayed]

FINDINGS: COMBINED FINDINGS FOR BOTH MR ABDOMEN AND PELVIS

Lower chest: No pleural fluid.

Hepatobiliary: Imaged liver, gallbladder, and biliary tract within
normal limits.

Pancreas:  Normal, without mass or ductal dilatation.

Spleen:  Normal in size, without focal abnormality.

Adrenals/Urinary Tract: Normal adrenal glands. Normal left kidney.
Minimal right-sided caliectasis is likely physiologic. Normal
urinary bladder.

Stomach/Bowel: Normal stomach and small bowel loops. Moderate amount
of ascending colonic stool. The terminal ileum and ileocecal
junction are identified on [DATE]. The cecum is felt to extend into
the right upper quadrant. The appendix is not confidently identified
on axial images. May track medially towards the uterine fundus on
coronal image [DATE]. No pericecal inflammation.

Vascular/Lymphatic: Normal aortic caliber. No abdominopelvic
adenopathy.

Reproductive: Intrauterine pregnancy in cephalic presentation.
Grossly normal fluid volume. Anteriorly positioned placenta. No
dominant adnexal mass.

Other:  No significant free fluid.

Musculoskeletal: Minimal convex left lumbar spine curvature.
IMPRESSION: 1. No acute process or explanation for right-sided pain. Equivocal
visualization of the appendix, without evidence of pericecal
inflammation to suggest appendicitis.
2. Right-sided minimal caliectasis, felt to be physiologic.
